# Patient Record
Sex: Female | Born: 1935 | Race: White | Hispanic: No | Smoking: Never smoker
Health system: Southern US, Community
[De-identification: ages and names within clinical notes are randomized; demographics above are authoritative.]

## PROBLEM LIST (undated history)

## (undated) DIAGNOSIS — I639 Cerebral infarction, unspecified: Secondary | ICD-10-CM

## (undated) DIAGNOSIS — K219 Gastro-esophageal reflux disease without esophagitis: Secondary | ICD-10-CM

## (undated) DIAGNOSIS — F32A Depression, unspecified: Secondary | ICD-10-CM

## (undated) DIAGNOSIS — F329 Major depressive disorder, single episode, unspecified: Secondary | ICD-10-CM

## (undated) DIAGNOSIS — N390 Urinary tract infection, site not specified: Secondary | ICD-10-CM

## (undated) DIAGNOSIS — I10 Essential (primary) hypertension: Secondary | ICD-10-CM

## (undated) DIAGNOSIS — E7800 Pure hypercholesterolemia, unspecified: Secondary | ICD-10-CM

## (undated) HISTORY — PX: CEREBRAL ANEURYSM REPAIR: SHX164

---

## 2009-01-13 ENCOUNTER — Ambulatory Visit: Payer: Self-pay | Admitting: Family Medicine

## 2009-01-13 DIAGNOSIS — E785 Hyperlipidemia, unspecified: Secondary | ICD-10-CM

## 2009-01-13 DIAGNOSIS — I10 Essential (primary) hypertension: Secondary | ICD-10-CM

## 2009-01-13 DIAGNOSIS — L259 Unspecified contact dermatitis, unspecified cause: Secondary | ICD-10-CM

## 2009-01-13 DIAGNOSIS — J069 Acute upper respiratory infection, unspecified: Secondary | ICD-10-CM | POA: Insufficient documentation

## 2009-01-14 ENCOUNTER — Encounter: Payer: Self-pay | Admitting: Family Medicine

## 2015-09-04 ENCOUNTER — Encounter (HOSPITAL_COMMUNITY): Payer: Self-pay | Admitting: Emergency Medicine

## 2015-09-04 ENCOUNTER — Inpatient Hospital Stay (HOSPITAL_COMMUNITY)
Admission: EM | Admit: 2015-09-04 | Discharge: 2015-09-10 | DRG: 689 | Disposition: A | Discharge status: Skilled Nursing Facility | Payer: Medicare Other | Attending: Internal Medicine | Admitting: Internal Medicine

## 2015-09-04 ENCOUNTER — Emergency Department (HOSPITAL_COMMUNITY): Payer: Medicare Other

## 2015-09-04 DIAGNOSIS — R739 Hyperglycemia, unspecified: Secondary | ICD-10-CM | POA: Diagnosis present

## 2015-09-04 DIAGNOSIS — F0390 Unspecified dementia without behavioral disturbance: Secondary | ICD-10-CM | POA: Diagnosis present

## 2015-09-04 DIAGNOSIS — R319 Hematuria, unspecified: Secondary | ICD-10-CM

## 2015-09-04 DIAGNOSIS — I10 Essential (primary) hypertension: Secondary | ICD-10-CM | POA: Diagnosis present

## 2015-09-04 DIAGNOSIS — N179 Acute kidney failure, unspecified: Secondary | ICD-10-CM | POA: Diagnosis present

## 2015-09-04 DIAGNOSIS — E7800 Pure hypercholesterolemia, unspecified: Secondary | ICD-10-CM | POA: Diagnosis present

## 2015-09-04 DIAGNOSIS — E876 Hypokalemia: Secondary | ICD-10-CM | POA: Diagnosis present

## 2015-09-04 DIAGNOSIS — E860 Dehydration: Secondary | ICD-10-CM | POA: Diagnosis present

## 2015-09-04 DIAGNOSIS — F329 Major depressive disorder, single episode, unspecified: Secondary | ICD-10-CM | POA: Diagnosis present

## 2015-09-04 DIAGNOSIS — Z79899 Other long term (current) drug therapy: Secondary | ICD-10-CM | POA: Diagnosis not present

## 2015-09-04 DIAGNOSIS — L8990 Pressure ulcer of unspecified site, unspecified stage: Secondary | ICD-10-CM | POA: Diagnosis not present

## 2015-09-04 DIAGNOSIS — E875 Hyperkalemia: Secondary | ICD-10-CM | POA: Diagnosis present

## 2015-09-04 DIAGNOSIS — G8191 Hemiplegia, unspecified affecting right dominant side: Secondary | ICD-10-CM | POA: Diagnosis present

## 2015-09-04 DIAGNOSIS — F32A Depression, unspecified: Secondary | ICD-10-CM | POA: Diagnosis present

## 2015-09-04 DIAGNOSIS — K219 Gastro-esophageal reflux disease without esophagitis: Secondary | ICD-10-CM | POA: Diagnosis present

## 2015-09-04 DIAGNOSIS — R32 Unspecified urinary incontinence: Secondary | ICD-10-CM | POA: Diagnosis present

## 2015-09-04 DIAGNOSIS — Z66 Do not resuscitate: Secondary | ICD-10-CM | POA: Diagnosis present

## 2015-09-04 DIAGNOSIS — N390 Urinary tract infection, site not specified: Secondary | ICD-10-CM | POA: Diagnosis present

## 2015-09-04 DIAGNOSIS — E785 Hyperlipidemia, unspecified: Secondary | ICD-10-CM

## 2015-09-04 DIAGNOSIS — G9340 Encephalopathy, unspecified: Secondary | ICD-10-CM | POA: Diagnosis present

## 2015-09-04 HISTORY — DX: Major depressive disorder, single episode, unspecified: F32.9

## 2015-09-04 HISTORY — DX: Gastro-esophageal reflux disease without esophagitis: K21.9

## 2015-09-04 HISTORY — DX: Cerebral infarction, unspecified: I63.9

## 2015-09-04 HISTORY — DX: Depression, unspecified: F32.A

## 2015-09-04 HISTORY — DX: Pure hypercholesterolemia, unspecified: E78.00

## 2015-09-04 HISTORY — DX: Urinary tract infection, site not specified: N39.0

## 2015-09-04 HISTORY — DX: Essential (primary) hypertension: I10

## 2015-09-04 LAB — COMPREHENSIVE METABOLIC PANEL
ALT: 24 U/L (ref 14–54)
AST: 37 U/L (ref 15–41)
Albumin: 3.6 g/dL (ref 3.5–5.0)
Alkaline Phosphatase: 92 U/L (ref 38–126)
Anion gap: 11 (ref 5–15)
BUN: 112 mg/dL — ABNORMAL HIGH (ref 6–20)
CO2: 15 mmol/L — ABNORMAL LOW (ref 22–32)
Calcium: 9.8 mg/dL (ref 8.9–10.3)
Chloride: 106 mmol/L (ref 101–111)
Creatinine, Ser: 4.9 mg/dL — ABNORMAL HIGH (ref 0.44–1.00)
GFR calc Af Amer: 9 mL/min — ABNORMAL LOW (ref >60)
GFR calc non Af Amer: 7 mL/min — ABNORMAL LOW (ref >60)
Glucose, Bld: 123 mg/dL — ABNORMAL HIGH (ref 65–99)
Potassium: 5.9 mmol/L — ABNORMAL HIGH (ref 3.5–5.1)
Sodium: 132 mmol/L — ABNORMAL LOW (ref 135–145)
Total Bilirubin: 1 mg/dL (ref 0.3–1.2)
Total Protein: 7.1 g/dL (ref 6.5–8.1)

## 2015-09-04 LAB — CBC WITH DIFFERENTIAL/PLATELET
Basophils Absolute: 0 10*3/uL (ref 0.0–0.1)
Basophils Relative: 0 %
Eosinophils Absolute: 0.2 10*3/uL (ref 0.0–0.7)
Eosinophils Relative: 2 %
HCT: 36.7 % (ref 36.0–46.0)
Hemoglobin: 11.8 g/dL — ABNORMAL LOW (ref 12.0–15.0)
Lymphocytes Relative: 10 %
Lymphs Abs: 1.1 10*3/uL (ref 0.7–4.0)
MCH: 29 pg (ref 26.0–34.0)
MCHC: 32.2 g/dL (ref 30.0–36.0)
MCV: 90.2 fL (ref 78.0–100.0)
Monocytes Absolute: 0.5 10*3/uL (ref 0.1–1.0)
Monocytes Relative: 5 %
Neutro Abs: 8.7 10*3/uL — ABNORMAL HIGH (ref 1.7–7.7)
Neutrophils Relative %: 83 %
Platelets: 484 10*3/uL — ABNORMAL HIGH (ref 150–400)
RBC: 4.07 MIL/uL (ref 3.87–5.11)
RDW: 16.3 % — ABNORMAL HIGH (ref 11.5–15.5)
WBC: 10.5 10*3/uL (ref 4.0–10.5)

## 2015-09-04 LAB — URINALYSIS, ROUTINE W REFLEX MICROSCOPIC
Bilirubin Urine: NEGATIVE
Glucose, UA: NEGATIVE mg/dL
Ketones, ur: NEGATIVE mg/dL
Nitrite: NEGATIVE
Protein, ur: 100 mg/dL — AB
Specific Gravity, Urine: 1.016 (ref 1.005–1.030)
pH: 5.5 (ref 5.0–8.0)

## 2015-09-04 LAB — I-STAT CG4 LACTIC ACID, ED: Lactic Acid, Venous: 1.9 mmol/L (ref 0.5–2.0)

## 2015-09-04 LAB — URINE MICROSCOPIC-ADD ON

## 2015-09-04 MED ORDER — DEXTROSE 5 % IV SOLN
1.0000 g | Freq: Once | INTRAVENOUS | Status: AC
  Administered 2015-09-04: 1 g via INTRAVENOUS
  Filled 2015-09-04: qty 1

## 2015-09-04 MED ORDER — HYDRALAZINE HCL 20 MG/ML IJ SOLN: 5.0000 mg | INTRAMUSCULAR | Status: DC | PRN

## 2015-09-04 MED ORDER — CALCIUM CARBONATE-VITAMIN D 500-200 MG-UNIT PO TABS
1.0000 | ORAL_TABLET | Freq: Two times a day (BID) | ORAL | Status: DC
  Administered 2015-09-05 – 2015-09-10 (×10): 1 via ORAL
  Filled 2015-09-04 (×11): qty 1

## 2015-09-04 MED ORDER — ATORVASTATIN CALCIUM 20 MG PO TABS
20.0000 mg | ORAL_TABLET | Freq: Every day | ORAL | Status: DC
  Administered 2015-09-05 – 2015-09-09 (×5): 20 mg via ORAL
  Filled 2015-09-04 (×5): qty 1
  Filled 2015-09-04: qty 2
  Filled 2015-09-04: qty 1
  Filled 2015-09-04 (×4): qty 2
  Filled 2015-09-04: qty 1
  Filled 2015-09-04: qty 2

## 2015-09-04 MED ORDER — PAROXETINE HCL 20 MG PO TABS
20.0000 mg | ORAL_TABLET | Freq: Every day | ORAL | Status: DC
  Administered 2015-09-05 – 2015-09-10 (×6): 20 mg via ORAL
  Filled 2015-09-04 (×6): qty 1

## 2015-09-04 MED ORDER — ONDANSETRON HCL 4 MG PO TABS: 4.0000 mg | ORAL_TABLET | Freq: Four times a day (QID) | ORAL | Status: DC | PRN

## 2015-09-04 MED ORDER — GLUCOSAMINE SULFATE 1000 MG PO TABS: 1.0000 | ORAL_TABLET | Freq: Every day | ORAL | Status: DC

## 2015-09-04 MED ORDER — ACETAMINOPHEN 325 MG PO TABS
650.0000 mg | ORAL_TABLET | Freq: Four times a day (QID) | ORAL | Status: DC | PRN
  Administered 2015-09-07 – 2015-09-10 (×3): 650 mg via ORAL
  Filled 2015-09-04 (×3): qty 2

## 2015-09-04 MED ORDER — SODIUM BICARBONATE 8.4 % IV SOLN
50.0000 meq | Freq: Once | INTRAVENOUS | Status: AC
  Administered 2015-09-05: 50 meq via INTRAVENOUS
  Filled 2015-09-04: qty 50

## 2015-09-04 MED ORDER — HEPARIN SODIUM (PORCINE) 5000 UNIT/ML IJ SOLN
5000.0000 [IU] | Freq: Three times a day (TID) | INTRAMUSCULAR | Status: DC
  Administered 2015-09-05 – 2015-09-10 (×16): 5000 [IU] via SUBCUTANEOUS
  Filled 2015-09-04 (×15): qty 1

## 2015-09-04 MED ORDER — SODIUM CHLORIDE 0.9 % IV BOLUS (SEPSIS): 500.0000 mL | INTRAVENOUS | Status: AC

## 2015-09-04 MED ORDER — ACETAMINOPHEN 650 MG RE SUPP: 650.0000 mg | Freq: Four times a day (QID) | RECTAL | Status: DC | PRN

## 2015-09-04 MED ORDER — ONDANSETRON HCL 4 MG/2ML IJ SOLN: 4.0000 mg | Freq: Four times a day (QID) | INTRAMUSCULAR | Status: DC | PRN

## 2015-09-04 MED ORDER — SODIUM POLYSTYRENE SULFONATE 15 GM/60ML PO SUSP
15.0000 g | Freq: Once | ORAL | Status: DC
  Filled 2015-09-04: qty 60

## 2015-09-04 MED ORDER — DEXTROSE 50 % IV SOLN
1.0000 | Freq: Once | INTRAVENOUS | Status: AC
  Administered 2015-09-05: 50 mL via INTRAVENOUS
  Filled 2015-09-04: qty 50

## 2015-09-04 MED ORDER — CEFEPIME HCL 2 G IJ SOLR
250.0000 mg | INTRAMUSCULAR | Status: DC
  Administered 2015-09-05 – 2015-09-08 (×4): 250 mg via INTRAVENOUS
  Filled 2015-09-04 (×6): qty 0.25

## 2015-09-04 MED ORDER — INSULIN ASPART 100 UNIT/ML IV SOLN
5.0000 [IU] | Freq: Once | INTRAVENOUS | Status: AC
  Administered 2015-09-05: 5 [IU] via INTRAVENOUS
  Filled 2015-09-04: qty 0.05

## 2015-09-04 MED ORDER — FAMOTIDINE 20 MG PO TABS
10.0000 mg | ORAL_TABLET | Freq: Every day | ORAL | Status: DC
  Administered 2015-09-05 – 2015-09-10 (×6): 10 mg via ORAL
  Filled 2015-09-04 (×6): qty 1

## 2015-09-04 MED ORDER — ALBUTEROL SULFATE (2.5 MG/3ML) 0.083% IN NEBU
10.0000 mg | INHALATION_SOLUTION | Freq: Once | RESPIRATORY_TRACT | Status: AC
  Administered 2015-09-04: 10 mg via RESPIRATORY_TRACT
  Filled 2015-09-04: qty 12

## 2015-09-04 MED ORDER — SODIUM CHLORIDE 0.9 % IV BOLUS (SEPSIS)
1000.0000 mL | INTRAVENOUS | Status: AC
  Administered 2015-09-04: 1000 mL via INTRAVENOUS

## 2015-09-04 MED ORDER — DEXTROSE 5 % IV SOLN
2.0000 g | Freq: Once | INTRAVENOUS | Status: DC
  Filled 2015-09-04: qty 2

## 2015-09-04 MED ORDER — SODIUM POLYSTYRENE SULFONATE 15 GM/60ML PO SUSP
30.0000 g | Freq: Once | ORAL | Status: AC
  Administered 2015-09-05: 30 g via ORAL

## 2015-09-04 MED ORDER — ADULT MULTIVITAMIN W/MINERALS CH
1.0000 | ORAL_TABLET | Freq: Every day | ORAL | Status: DC
  Administered 2015-09-05 – 2015-09-10 (×6): 1 via ORAL
  Filled 2015-09-04 (×6): qty 1

## 2015-09-04 MED ORDER — SODIUM CHLORIDE 0.9 % IV SOLN
INTRAVENOUS | Status: DC
  Administered 2015-09-05 – 2015-09-10 (×9): via INTRAVENOUS

## 2015-09-04 MED ORDER — CLOTRIMAZOLE 1 % EX CREA
TOPICAL_CREAM | Freq: Two times a day (BID) | CUTANEOUS | Status: DC
  Administered 2015-09-05 – 2015-09-10 (×11): via TOPICAL
  Filled 2015-09-04: qty 15

## 2015-09-04 MED ORDER — SODIUM CHLORIDE 0.9% FLUSH
3.0000 mL | Freq: Two times a day (BID) | INTRAVENOUS | Status: DC
  Administered 2015-09-06 – 2015-09-09 (×7): 3 mL via INTRAVENOUS

## 2015-09-04 NOTE — ED Notes (Signed)
Bed: WA07 Expected date:  Expected time:  Means of arrival:  Comments: EMS-UTI 

## 2015-09-04 NOTE — ED Notes (Signed)
Per EMS: Pt coming from [facility], pt has been treated for past 2 weeks for UTI, today has had confusion and increased weakness. Pt has pressure ulcer to buttocks area, states it is new.

## 2015-09-04 NOTE — H&P (Addendum)
[facility] History and Physical  Caitlin Wilkins ZOX:[id number] DOB: 04-Aug-1928 DOA: 09/04/2015  Referring physician: ED physician PCP: No primary care provider on file.  Specialists:   Chief Complaint: Altered mental status, generalized weakness, nausea, dysuria  HPI: Caitlin Wilkins is a 82 y.o. female with PMH of hypertension, hyperlipidemia, GERD, CVA, brain aneurysm (s/p of coiling per her son), right sided paralysis, who presents with altered mental status, generalized weakness, nausea and dysuria.  Per her son, patient has been confused in the past 2 or 3 days. She also has generalized weakness, nausea, but no vomiting, diarrhea or abdominal pain. Patient has dysuria, burning on urination. She denies chest pain, shortness of breath, cough, rashes. Patient has right-sided paralysis due to previous CVA and brain aneurysm coiling, which is her baseline.  In ED, patient was found to have positive urinalysis with moderate amount of leukocytes, lactate 1.90, WBC 10.5, temperature normal, no tachycardia, no tachypnea, potassium 5.9 with mild T-wave peaking in V4-V5, sodium 132, creatinine 4.90, bicarbonate 15. CT-renal stone study showed asymmetric right and posterior bladder wall thickening. No nephrolithiasis or hydroureteronephrosis bilaterally. Patient is admitted to inpatient for further evaluation and treatment.  EKG: Independently reviewed. QTC 427, LAD, mild T-wave peaking in in V 4-V5.   Where does patient live? Assistant living facility  Can patient participate in ADLs? Barely   Review of Systems: Could not be reviewed accurately due to altered mental status.  Allergy:  Allergies  Allergen Reactions  . Baclofen     Heart racing  . Oxycodone-Aspirin Nausea And Vomiting  . Sulfasalazine     Past Medical History  Diagnosis Date  . CVA (cerebral infarction)   . Hypercholesteremia   . Hypertension   . UTI (urinary tract infection)   . Depression   . GERD (gastroesophageal  reflux disease)     Past Surgical History  Procedure Laterality Date  . Cerebral aneurysm repair      s/p of coling per her son with right-sided paralysis    Social History:  reports that she has never smoked. She does not have any smokeless tobacco history on file. She reports that she does not drink alcohol. Her drug history is not on file.  Family History: Reviewed with patient, but patient does not know any of her family medical history.  Prior to Admission medications   Medication Sig Start Date End Date Taking? Authorizing Provider  atorvastatin (LIPITOR) 20 MG tablet Take 20 mg by mouth daily. 06/02/15  Yes Historical Provider, MD  Calcium Carbonate-Vitamin D (CALCIUM 600+D) 600-200 MG-UNIT TABS Take 1 tablet by mouth 2 (two) times daily.   Yes Historical Provider, MD  clotrimazole-betamethasone (LOTRISONE) cream Apply 1 application topically 2 (two) times daily as needed. irritations 08/01/15 07/31/16 Yes Historical Provider, MD  famotidine (PEPCID) 10 MG tablet Take 10 mg by mouth daily.   Yes Historical Provider, MD  Glucosamine Sulfate 1000 MG TABS Take 1 tablet by mouth daily.   Yes Historical Provider, MD  hydrochlorothiazide (HYDRODIURIL) 25 MG tablet Take 25 mg by mouth daily. 05/08/15 05/07/16 Yes Historical Provider, MD  Multiple Vitamins-Minerals (MULTIVITAMIN & MINERAL PO) Take 1 tablet by mouth daily.   Yes Historical Provider, MD  naproxen sodium (ANAPROX) 220 MG tablet Take 220 mg by mouth 2 (two) times daily as needed. pain   Yes Historical Provider, MD  PARoxetine (PAXIL) 20 MG tablet Take 20 mg by mouth daily. 05/08/15  Yes Historical Provider, MD  potassium chloride SA (KLOR-CON M20) 20 MEQ tablet  Take 20 mEq by mouth daily. 05/08/15 05/07/16 Yes Historical Provider, MD    Physical Exam: Filed Vitals:   09/04/15 2128 09/04/15 2200 09/04/15 2230 09/04/15 2303  BP:  124/54 112/46 135/46  Pulse:  101 104 110  Temp:    97.9 F (36.6 C)  TempSrc:    Oral  Resp:     20  Height:    5\' 2"  (1.575 m)  Weight:      SpO2: 90% 99% 96% 97%   General: Not in acute distress HEENT:       Eyes: PERRL, EOMI, no scleral icterus.       ENT: No discharge from the ears and nose, no pharynx injection, no tonsillar enlargement.        Neck: No JVD, no bruit, no mass felt. Heme: No neck lymph node enlargement. Cardiac: S1/S2, RRR, No murmurs, No gallops or rubs. Pulm: No rales, wheezing, rhonchi or rubs. Abd: Soft, nondistended, nontender, no rebound pain, no organomegaly, BS present. Ext: No pitting leg edema bilaterally. 2+DP/PT pulse bilaterally. Musculoskeletal: No joint deformities, No joint redness or warmth, no limitation of ROM in spin. Skin: Has decubitus ulcer over buttock  Neuro: Alert, confused, oriented to person (knows her son), but not time and place, cranial nerves II-XII grossly intact, has right-sided paralysis  Psych: Patient is not psychotic, no suicidal or hemocidal ideation.  Labs on Admission:  Basic Metabolic Panel:  Recent Labs Lab 09/04/15 1758  NA 132*  K 5.9*  CL 106  CO2 15*  GLUCOSE 123*  BUN 112*  CREATININE 4.90*  CALCIUM 9.8   Liver Function Tests:  Recent Labs Lab 09/04/15 1758  AST 37  ALT 24  ALKPHOS 92  BILITOT 1.0  PROT 7.1  ALBUMIN 3.6   No results for input(s): LIPASE, AMYLASE in the last 168 hours. No results for input(s): AMMONIA in the last 168 hours. CBC:  Recent Labs Lab 09/04/15 1758  WBC 10.5  NEUTROABS 8.7*  HGB 11.8*  HCT 36.7  MCV 90.2  PLT 484*   Cardiac Enzymes: No results for input(s): CKTOTAL, CKMB, CKMBINDEX, TROPONINI in the last 168 hours.  BNP (last 3 results) No results for input(s): BNP in the last 8760 hours.  ProBNP (last 3 results) No results for input(s): PROBNP in the last 8760 hours.  CBG: No results for input(s): GLUCAP in the last 168 hours.  Radiological Exams on Admission: Ct Renal Stone Study  09/04/2015  CLINICAL DATA:  Hematuria, multiple urinary  tract infection recently. EXAM: CT ABDOMEN AND PELVIS WITHOUT CONTRAST TECHNIQUE: Multidetector CT imaging of the abdomen and pelvis was performed following the standard protocol without IV contrast. COMPARISON:  None. FINDINGS: Calcified granulomas are identified within the liver. No focal liver lesion is identified. There is question sludge in the gallbladder. There is no inflammation surrounding the gallbladder. Calcified granuloma is identified in the spleen. The spleen is otherwise normal. The pancreas is normal. The adrenal glands are normal. There is no nephrolithiasis or hydroureteronephrosis bilaterally. Atherosclerosis of the aorta is identified without aneurysmal dilatation. There is no abdominal lymphadenopathy. There is no small bowel obstruction or diverticulitis. The appendix is not seen but no inflammation is noted around the cecum. The bladder is partially decompressed. There is asymmetric right and posterior bladder wall thickening. The uterus is normal. The lung bases are clear. Chronic compression deformity of L4 is identified. IMPRESSION: Asymmetric right and posterior bladder wall thickening. Further evaluation with direct with visualization on outpatient basis is recommended  to exclude underlying neoplasm. No nephrolithiasis or hydroureteronephrosis bilaterally. Electronically Signed   By: Sherian Rein M.D.   On: 09/04/2015 20:53    Assessment/Plan Principal Problem:   UTI (lower urinary tract infection) Active Problems:   HLD (hyperlipidemia)   Essential hypertension   Acute encephalopathy   Decubitus ulcer   Hyperkalemia   AKI (acute kidney injury) (HCC)   Depression   GERD (gastroesophageal reflux disease)   UTI (lower urinary tract infection): Patient with altered mental status, nausea and generalized weakness are most likely caused by UTI given positive urinalysis and dysuria. She  not septic on admission. Hemodynamically stable.   - Admit to telemetry (due to  hyperkalemia) - IV cefepime  was started by EDP, we'll continue - Follow up results of urine and blood cx and amend antibiotic regimen if needed per sensitivity results - prn Zofran for nausea - IVF: 2.5L of NS bolus in ED, followed by 75 cc/h  Acute encephalopathy: Most likely multifactorial, including UTI, electrolytes disturbance and aki -Frequent neuro check -Treat underlying issues.  Hyperkalemia: Potassium 5.9 with mild T-wave peaking. This is due to AKI and continuation of potassium supplement -treated with D50, 5 unit of Novolog, 50 mEq of sodium bicarbonate plus 30 g of Kayexalate -f/u by BMP -D/C potassium supplement  AKI: Creatinine 4.90, BUN 112. No previous creatinine on record. Assuming this is acute renal injury. No hydronephrosis by CT scan. Likely due to prerenal secondary to dehydration and continuation of diruetics and NSAIDs  - IVF as above - Check FeUrea - Follow up renal function by BMP - Hold HCTZ and naproxen.  HLD: Last LDL was not on record -Continue home medications: Lipitor -Check FLP  Essential hypertension: -Hold HCTZ due to worsening renal function -IV hydralazine when necessary  Depression: stable -continue Paxil  GERD: -Pepcid  Decubitus ulcer: -Consult wound care team  DVT ppx: Sq Heparin (Pt has AKI, sq heparin is better choice than sq Lovenox) Code Status: DNR Family Communication:  Yes, patient's son at bed side Disposition Plan: Admit to inpatient   Date of Service 09/04/2015    Lorretta Harp [facility] Pager [phone]  If 7PM-7AM, please contact night-coverage [email or web address] Password [facility] 09/04/2015, 11:29 PM

## 2015-09-04 NOTE — ED Notes (Signed)
I ATTEMPTED TO COLLECT LABS AND WAS UNSUCCESSFUL. 

## 2015-09-04 NOTE — Progress Notes (Signed)
Pharmacy Antibiotic Note  Caitlin Wilkins is a 82 y.o. female admitted on 09/04/2015 with UTI.  Patient presents with AMS from an assisted living facility and has been treated for a UTI for the past two weeks per MD note.  Antibiotic taken not reported.  Pharmacy has been consulted for Cefepime dosing.  Unsure of baseline renal status but today's SCr 4.90.  CrCl<10 ml/min.  Plan: Cefepime 1g x 1 given in ED.  After this dose, start 250 mg IV q24h. F/u SCr for dose adjustments. F/u narrowing of antibiotics, not sure if Pseudomonas coverage is needed here.     Temp (24hrs), Avg:97.5 F (36.4 C), Min:97.5 F (36.4 C), Max:97.5 F (36.4 C)   Recent Labs Lab 09/04/15 1758 09/04/15 1810  WBC 10.5  --   CREATININE 4.90*  --   LATICACIDVEN  --  1.90    CrCl cannot be calculated (Unknown ideal weight.).    Allergies  Allergen Reactions  . Baclofen     Heart racing  . Oxycodone-Aspirin Nausea And Vomiting  . Sulfasalazine     Antimicrobials this admission: 2/23 Cefepime >>   Dose adjustments this admission: -  Microbiology results: 2/23 BCx: collected 2/23 UCx: collected   Thank you for allowing pharmacy to be a part of this patient's care.  Clance Boll 09/04/2015 8:19 PM

## 2015-09-04 NOTE — ED Provider Notes (Signed)
CSN: [id number]     Arrival date & time 09/04/15  1707 History   First MD Initiated Contact with Patient 09/04/15 1720     Chief Complaint  Patient presents with  . Urinary Tract Infection     (Consider location/radiation/quality/duration/timing/severity/associated sxs/prior Treatment) Patient is a 82 y.o. female presenting with urinary tract infection.  Urinary Tract Infection  Patient presents to the Emergency Department via EMS from a SNF. Patient does not respond to questions appropriately and is altered. EMS reports that she presented from her facility due to increased confusion and weakness today. Patient has been treated for a UTI for the past two weeks. EMS states that she has a pressure ulcer. Patient cannot communicate that she has a new ulcer. Because she is altered, an accurate ROS was not obtained.  Past Medical History  Diagnosis Date  . CVA (cerebral infarction)   . Hypercholesteremia   . Hypertension   . UTI (urinary tract infection)    No past surgical history on file. No family history on file. Social History  Substance Use Topics  . Smoking status: Never Smoker   . Smokeless tobacco: None  . Alcohol Use: No   OB History    No data available     Review of Systems   All other systems negative except as documented in the HPI. All pertinent positives and negatives as reviewed in the HPI.   Allergies  Oxycodone-aspirin and Sulfasalazine  Home Medications   Prior to Admission medications   Not on File   BP 146/81 mmHg  Temp(Src) 97.5 F (36.4 C) (Rectal)  Resp 18 Physical Exam  Constitutional: No distress.  Patient looks slightly malnourished  HENT:  Head: Normocephalic and atraumatic.  Right Ear: External ear normal.  Left Ear: External ear normal.  Eyes: Conjunctivae and EOM are normal. Pupils are equal, round, and reactive to light.  Neck: Normal range of motion. Neck supple.  Cardiovascular: Normal rate and regular rhythm.   No murmur  heard. Pulmonary/Chest: Effort normal and breath sounds normal. No respiratory distress. She has no wheezes. She has no rales.  Abdominal: Soft. Bowel sounds are normal. She exhibits no distension. There is no tenderness. There is no rebound.  Genitourinary:    There is tenderness and lesion on the right labia. There is tenderness on the left labia. There is erythema in the vagina. There are signs of injury in the vagina.  Musculoskeletal: She exhibits no edema or tenderness.  Patient's right hand is contracted  Neurological: She is alert.  Patient is altered, does not respond appropriately to questions. Is not oriented to time, place.   Skin: There is pallor.  Cool extremities    ED Course  Procedures (including critical care time) Labs Review Labs Reviewed  COMPREHENSIVE METABOLIC PANEL - Abnormal; Notable for the following:    Sodium 132 (*)    Potassium 5.9 (*)    CO2 15 (*)    Glucose, Bld 123 (*)    BUN 112 (*)    Creatinine, Ser 4.90 (*)    GFR calc non Af Amer 7 (*)    GFR calc Af Amer 9 (*)    All other components within normal limits  CBC WITH DIFFERENTIAL/PLATELET - Abnormal; Notable for the following:    Hemoglobin 11.8 (*)    RDW 16.3 (*)    Platelets 484 (*)    Neutro Abs 8.7 (*)    All other components within normal limits  URINALYSIS, ROUTINE W REFLEX MICROSCOPIC (  NOT AT [facility]) - Abnormal; Notable for the following:    APPearance CLOUDY (*)    Hgb urine dipstick LARGE (*)    Protein, ur 100 (*)    Leukocytes, UA MODERATE (*)    All other components within normal limits  URINE MICROSCOPIC-ADD ON - Abnormal; Notable for the following:    Squamous Epithelial / LPF 6-30 (*)    Bacteria, UA MANY (*)    All other components within normal limits  PROTIME-INR - Abnormal; Notable for the following:    Prothrombin Time 15.7 (*)    All other components within normal limits  URINE CULTURE  CULTURE, BLOOD (ROUTINE X 2)  CULTURE, BLOOD (ROUTINE X 2)  MRSA PCR  SCREENING  APTT  CREATININE, URINE, RANDOM  UREA NITROGEN, URINE  LIPID PANEL  COMPREHENSIVE METABOLIC PANEL  CBC  I-STAT CG4 LACTIC ACID, ED    Imaging Review No results found. I have personally reviewed and evaluated these images and lab results as part of my medical decision-making.   EKG Interpretation None     1830 son and grandson present in the room, state that patient lives in an independent living facility and that she often does not change her briefs. They have been sending CNAs to go and check on her frequently but even then she still goes long periods of time without changing them. They state that sometimes patient has difficulty communicating what she wants to say, but that today she is altered and confused and is not answering questions appropriately at all.  MDM   Final diagnoses:  None   patient be admitted to the hospital for further evaluation and care.  Spoke with the [facility] who will see the patient      Charlestine Night, PA-C 09/05/15 1610  Derwood Kaplan, MD 09/05/15 [phone]

## 2015-09-04 NOTE — Progress Notes (Signed)
PHARMACIST - PHYSICIAN ORDER COMMUNICATION  CONCERNING: P&T Medication Policy on Herbal Medications  DESCRIPTION:  This patient's order for: Glucosamine has been noted.  This product(s) is classified as an "herbal" or natural product. Due to a lack of definitive safety studies or FDA approval, nonstandard manufacturing practices, plus the potential risk of unknown drug-drug interactions while on inpatient medications, the Pharmacy and Therapeutics Committee does not permit the use of "herbal" or natural products of this type within [facility].   ACTION TAKEN: The pharmacy department is unable to verify this order at this time and your patient has been informed of this safety policy. Please reevaluate patient's clinical condition at discharge and address if the herbal or natural product(s) should be resumed at that time.  Charolotte Eke, PharmD, pager [phone]. 09/04/2015,9:57 PM.

## 2015-09-05 DIAGNOSIS — F329 Major depressive disorder, single episode, unspecified: Secondary | ICD-10-CM

## 2015-09-05 LAB — COMPREHENSIVE METABOLIC PANEL
ALK PHOS: 70 U/L (ref 38–126)
ALT: 20 U/L (ref 14–54)
AST: 28 U/L (ref 15–41)
Albumin: 2.8 g/dL — ABNORMAL LOW (ref 3.5–5.0)
Anion gap: 12 (ref 5–15)
BILIRUBIN TOTAL: 0.8 mg/dL (ref 0.3–1.2)
BUN: 121 mg/dL — AB (ref 6–20)
CALCIUM: 9.1 mg/dL (ref 8.9–10.3)
CHLORIDE: 107 mmol/L (ref 101–111)
CO2: 12 mmol/L — ABNORMAL LOW (ref 22–32)
CREATININE: 4.65 mg/dL — AB (ref 0.44–1.00)
GFR, EST AFRICAN AMERICAN: 9 mL/min — AB (ref >60)
GFR, EST NON AFRICAN AMERICAN: 8 mL/min — AB (ref >60)
Glucose, Bld: 288 mg/dL — ABNORMAL HIGH (ref 65–99)
Potassium: 4.9 mmol/L (ref 3.5–5.1)
Sodium: 131 mmol/L — ABNORMAL LOW (ref 135–145)
TOTAL PROTEIN: 5.6 g/dL — AB (ref 6.5–8.1)

## 2015-09-05 LAB — CBC
HCT: 32.8 % — ABNORMAL LOW (ref 36.0–46.0)
Hemoglobin: 10.7 g/dL — ABNORMAL LOW (ref 12.0–15.0)
MCH: 29.8 pg (ref 26.0–34.0)
MCHC: 32.6 g/dL (ref 30.0–36.0)
MCV: 91.4 fL (ref 78.0–100.0)
PLATELETS: 369 10*3/uL (ref 150–400)
RBC: 3.59 MIL/uL — AB (ref 3.87–5.11)
RDW: 16.5 % — ABNORMAL HIGH (ref 11.5–15.5)
WBC: 8.5 10*3/uL (ref 4.0–10.5)

## 2015-09-05 LAB — LIPID PANEL
CHOLESTEROL: 110 mg/dL (ref 0–200)
HDL: 24 mg/dL — AB (ref >40)
LDL CALC: 53 mg/dL (ref 0–99)
TRIGLYCERIDES: 163 mg/dL — AB (ref <150)
Total CHOL/HDL Ratio: 4.6 RATIO
VLDL: 33 mg/dL (ref 0–40)

## 2015-09-05 LAB — CREATININE, URINE, RANDOM: Creatinine, Urine: 73.32 mg/dL

## 2015-09-05 LAB — APTT: aPTT: 26 seconds (ref 24–37)

## 2015-09-05 LAB — PROTIME-INR
INR: 1.28 (ref 0.00–1.49)
PROTHROMBIN TIME: 15.7 s — AB (ref 11.6–15.2)

## 2015-09-05 LAB — MRSA PCR SCREENING: MRSA BY PCR: NEGATIVE

## 2015-09-05 LAB — GLUCOSE, CAPILLARY: Glucose-Capillary: 133 mg/dL — ABNORMAL HIGH (ref 65–99)

## 2015-09-05 MED ORDER — ENSURE ENLIVE PO LIQD
237.0000 mL | Freq: Two times a day (BID) | ORAL | Status: DC
  Administered 2015-09-05 – 2015-09-10 (×8): 237 mL via ORAL

## 2015-09-05 NOTE — Progress Notes (Signed)
Pt has order for strict I&O.  Pt is incontinent.  Dr Arthor Captain aware, does not want foley catheter placed.  Continue to document output occurences.

## 2015-09-05 NOTE — Progress Notes (Signed)
[facility] PROGRESS NOTE   Caitlin Wilkins JXB:[id number] DOB: Dec 13, 1928 DOA: 09/04/2015 PCP: No primary care provider on file.  HPI/Subjective: Seen with son at bedside, she is awake but confused.  Assessment/Plan: Principal Problem:   UTI (lower urinary tract infection) Active Problems:   HLD (hyperlipidemia)   Essential hypertension   Acute encephalopathy   Decubitus ulcer   Hyperkalemia   AKI (acute kidney injury) (HCC)   Depression   GERD (gastroesophageal reflux disease)   UTI (lower urinary tract infection):  Presented with altered mental status, dysuria and urinalysis consistent with UTI. Started on cefepime in the emergency department will continue. We'll adjust antibiotics according to the culture results.  Acute encephalopathy:  Is likely multifactorial including UTI, dehydration and acute renal failure. Hopefully this will improve as the above-mentioned disturbances improves.  Hyperkalemia:  Potassium 5.9 with mild T-wave peaking. This is due to AKI and continuation of potassium supplement Potassium down to 4.9.  AKI:  Creatinine 4.90, BUN 112. No previous creatinine on record. Assuming this is acute renal injury. No hydronephrosis by CT scan. Likely due to prerenal secondary to dehydration and continuation of diruetics and NSAIDs  IV fluids, continue to watch intake/output very closely. Hold HCTZ and naproxen.  HLD: Last LDL was not on record -Continue home medications: Lipitor -Check FLP  Essential hypertension: -Hold HCTZ due to worsening renal function -IV hydralazine when necessary  Depression: stable -continue Paxil  GERD: -Pepcid  Decubitus ulcer: -Consult wound care team   Code Status: DNR Family Communication: Plan discussed with the patient. Disposition Plan: Remains inpatient Diet: Diet Heart Room service appropriate?: Yes with Assist; Fluid consistency::  Thin  Consultants:  None  Procedures:  None  Antibiotics:  Cefepime   Objective: Filed Vitals:   09/04/15 2303 09/05/15 0531  BP: 135/46 110/49  Pulse: 110 98  Temp: 97.9 F (36.6 C) 97.6 F (36.4 C)  Resp: 20 18    Intake/Output Summary (Last 24 hours) at 09/05/15 1223 Last data filed at 09/05/15 1000  Gross per 24 hour  Intake    315 ml  Output      2 ml  Net    313 ml   Filed Weights   09/04/15 2050 09/04/15 2303  Weight: 63.504 kg (140 lb) 50.8 kg (111 lb 15.9 oz)    Exam: General: Alert and awake, oriented x3, not in any acute distress. HEENT: anicteric sclera, pupils reactive to light and accommodation, EOMI CVS: S1-S2 clear, no murmur rubs or gallops Chest: clear to auscultation bilaterally, no wheezing, rales or rhonchi Abdomen: soft nontender, nondistended, normal bowel sounds, no organomegaly Extremities: no cyanosis, clubbing or edema noted bilaterally Neuro: Cranial nerves II-XII intact, no focal neurological deficits  Data Reviewed: Basic Metabolic Panel:  Recent Labs Lab 09/04/15 1758 09/05/15 0451  NA 132* 131*  K 5.9* 4.9  CL 106 107  CO2 15* 12*  GLUCOSE 123* 288*  BUN 112* 121*  CREATININE 4.90* 4.65*  CALCIUM 9.8 9.1   Liver Function Tests:  Recent Labs Lab 09/04/15 1758 09/05/15 0451  AST 37 28  ALT 24 20  ALKPHOS 92 70  BILITOT 1.0 0.8  PROT 7.1 5.6*  ALBUMIN 3.6 2.8*   No results for input(s): LIPASE, AMYLASE in the last 168 hours. No results for input(s): AMMONIA in the last 168 hours. CBC:  Recent Labs Lab 09/04/15 1758 09/05/15 0451  WBC 10.5 8.5  NEUTROABS 8.7*  --   HGB 11.8* 10.7*  HCT 36.7 32.8*  MCV 90.2  91.4  PLT 484* 369   Cardiac Enzymes: No results for input(s): CKTOTAL, CKMB, CKMBINDEX, TROPONINI in the last 168 hours. BNP (last 3 results) No results for input(s): BNP in the last 8760 hours.  ProBNP (last 3 results) No results for input(s): PROBNP in the last 8760  hours.  CBG:  Recent Labs Lab 09/05/15 0738  GLUCAP 133*    Micro Recent Results (from the past 240 hour(s))  MRSA PCR Screening     Status: None   Collection Time: 09/05/15 12:38 AM  Result Value Ref Range Status   MRSA by PCR NEGATIVE NEGATIVE Final    Comment:        The GeneXpert MRSA Assay (FDA approved for NASAL specimens only), is one component of a comprehensive MRSA colonization surveillance program. It is not intended to diagnose MRSA infection nor to guide or monitor treatment for MRSA infections.      Studies: Ct Renal Stone Study  09/04/2015  CLINICAL DATA:  Hematuria, multiple urinary tract infection recently. EXAM: CT ABDOMEN AND PELVIS WITHOUT CONTRAST TECHNIQUE: Multidetector CT imaging of the abdomen and pelvis was performed following the standard protocol without IV contrast. COMPARISON:  None. FINDINGS: Calcified granulomas are identified within the liver. No focal liver lesion is identified. There is question sludge in the gallbladder. There is no inflammation surrounding the gallbladder. Calcified granuloma is identified in the spleen. The spleen is otherwise normal. The pancreas is normal. The adrenal glands are normal. There is no nephrolithiasis or hydroureteronephrosis bilaterally. Atherosclerosis of the aorta is identified without aneurysmal dilatation. There is no abdominal lymphadenopathy. There is no small bowel obstruction or diverticulitis. The appendix is not seen but no inflammation is noted around the cecum. The bladder is partially decompressed. There is asymmetric right and posterior bladder wall thickening. The uterus is normal. The lung bases are clear. Chronic compression deformity of L4 is identified. IMPRESSION: Asymmetric right and posterior bladder wall thickening. Further evaluation with direct with visualization on outpatient basis is recommended to exclude underlying neoplasm. No nephrolithiasis or hydroureteronephrosis bilaterally.  Electronically Signed   By: Sherian Rein M.D.   On: 09/04/2015 20:53    Scheduled Meds: . atorvastatin  20 mg Oral Daily  . calcium-vitamin D  1 tablet Oral BID  . ceFEPime (MAXIPIME) IV  250 mg Intravenous Q24H  . clotrimazole   Topical BID  . famotidine  10 mg Oral Daily  . heparin  5,000 Units Subcutaneous 3 times per day  . multivitamin with minerals  1 tablet Oral Daily  . PARoxetine  20 mg Oral Daily  . sodium chloride flush  3 mL Intravenous Q12H   Continuous Infusions: . sodium chloride 75 mL/hr at 09/05/15 0433       Time spent: 35 minutes    [facility] A  [facility] Pager [phone] If 7PM-7AM, please contact night-coverage at [email or web address], password [facility] 09/05/2015, 12:23 PM  LOS: 1 day

## 2015-09-05 NOTE — Consult Note (Signed)
WOC wound consult note Reason for Consult: IAD (incontinence associated dermatitis) with superficial skin injury (partial thickness, not pressure) Wound type: Moisture associated skin damage Pressure Ulcer POA: No Measurement: 6cm x 0.4cm x 0.2cm and distally 0.6cm round x 0.2cm Wound ZOX:WRUEA, pink moist Drainage (amount, consistency, odor) scant serous, no odor Periwound:erythematous, no induration, no warmth Dressing procedure/placement/frequency: POC will include turning and repositioning, heel flotation, and pressure injury prevention to ITs and sacrum as well as topical care. WOC nursing team will not follow, but will remain available to this patient, the nursing and medical teams.  Please re-consult if needed. Thanks, Ladona Mow, MSN, RN, GNP, [place]  Pager# [phone]

## 2015-09-05 NOTE — Progress Notes (Signed)
Initial Nutrition Assessment  DOCUMENTATION CODES:   Not applicable  INTERVENTION:  - Will order Ensure Enlive po BID, each supplement provides 350 kcal and 20 grams of protein - Tech/RN to assist with meal orders and feeding assistance - Encourage PO intakes of meals and supplements - RD will continue to monitor for needs  NUTRITION DIAGNOSIS:   Inadequate oral intake related to lethargy/confusion as evidenced by meal completion < 25%.  GOAL:   Patient will meet greater than or equal to 90% of their needs  MONITOR:   PO intake, Supplement acceptance, Weight trends, Labs, Skin, I & O's  REASON FOR ASSESSMENT:   Malnutrition Screening Tool  ASSESSMENT:   82 y.o. female with PMH of hypertension, hyperlipidemia, GERD, CVA, brain aneurysm (s/p of coiling per her son), right sided paralysis, who presents with altered mental status, generalized weakness, nausea and dysuria.  Pt seen for MST. BMI indicates normal weight. Pt was NPO until diet advancement to Heart Healthy diet at 0842 today. Per flowsheet, pt consumed 25% of breakfast this AM. Pt with AMS; she is unable to provide any information and no family/visitors are present at this time. Lunch tray had arrived prior to RD visit and tech alerted.  Physical assessment indicates mild to moderate muscle wasting to upper body, no fat wasting, no edema. No recent weight hx available in the chart for comparison to current weight.   Not meeting needs. Will continue to monitor intakes and associated needs. Medications reviewed. Labs reviewed; Na: 131 mmol/L, BUN/creatinine elevated, GFR: 8 mg/dL.   Diet Order:  Diet Heart Room service appropriate?: Yes with Assist; Fluid consistency:: Thin  Skin:  Wound (see comment) (Stage 2 bilateral buttocks pressure ulcer, Stage 1 bilateral heel pressure ulcers)  Last BM:  2/24  Height:   Ht Readings from Last 1 Encounters:  09/04/15  (1.575 m)    Weight:   Wt Readings from Last 1  Encounters:  09/04/15 111 lb 15.9 oz (50.8 kg)    Ideal Body Weight:  50 kg (kg)  BMI:  Body mass index is 20.48 kg/(m^2).  Estimated Nutritional Needs:   Kcal:  1350-1550  Protein:  55-65 grams  Fluid:  1.8-2 L/day  EDUCATION NEEDS:   No education needs identified at this time     Trenton Gammon, RD, LDN Inpatient Clinical Dietitian Pager # [phone] After hours/weekend pager # [phone]

## 2015-09-05 NOTE — Evaluation (Signed)
Occupational Therapy Evaluation Patient Details Name: Caitlin Wilkins MRN: [id number] DOB: Sep 13, 1928 Today's Date: 09/05/2015    History of Present Illness 82 yo female admitted 09/04/15 from [facility] independent living per noes with Altered mental status, generalized weakness, nausea, dysuria, UTI.   Clinical Impression   Pt admitted with above. She demonstrates the below listed deficits and will benefit from continued OT to maximize safety and independence with BADLs.  Pt presents to OT with residual Rt hemiplegia, impaired balance, generalized weakness, impaired cognition.  Currently, she requires total A for ADLs.  Per chart, pt resided at [facility].  If they are able to provide necessary level of assist at discharge, then recommend back to [facility] with HHOT.   If they are not able to provide necessary level of care, then she will need SNF level rehab.       Follow Up Recommendations  SNF;Home health OT;Supervision/Assistance - 24 hour    Equipment Recommendations  None recommended by OT    Recommendations for Other Services       Precautions / Restrictions Precautions Precautions: Fall Precaution Comments: R hemiplegia, incontinence      Mobility Bed Mobility Overal bed mobility: Needs Assistance;+2 for physical assistance;+ 2 for safety/equipment Bed Mobility: Rolling;Sidelying to Sit;Sit to Supine Rolling: Total assist;+2 for physical assistance;+2 for safety/equipment Sidelying to sit: Total assist;+2 for physical assistance;+2 for safety/equipment;HOB elevated   Sit to supine: Total assist;+2 for physical assistance;+2 for safety/equipment   General bed mobility comments: legs are rigid with turning to the sides, trunk is rigid also. Assist to  sit upright. patient initiated return to side after indicating that she is ready to lie down.  Transfers                 General transfer comment: patient declined by starting to lie down     Balance Overall balance assessment: Needs assistance Sitting-balance support: Feet supported;Single extremity supported Sitting balance-Leahy Scale: Poor   Postural control: Posterior lean;Right lateral lean                                  ADL Overall ADL's : Needs assistance/impaired Eating/Feeding: Moderate assistance;Bed level   Grooming: Wash/dry hands;Wash/dry face;Oral care;Brushing hair;Maximal assistance;Sitting;Bed level   Upper Body Bathing: Maximal assistance;Bed level   Lower Body Bathing: Total assistance;Bed level   Upper Body Dressing : Total assistance;Bed level   Lower Body Dressing: Total assistance;Bed level   Toilet Transfer: Total assistance Toilet Transfer Details (indicate cue type and reason): Pt refused OOB transfer  Toileting- Clothing Manipulation and Hygiene: Total assistance;Bed level       Functional mobility during ADLs: Total assistance General ADL Comments: Pt follow commands inconsitently.  Difficult to engage in activity      Vision Additional Comments: Pt unable to engage in visual assessment    Perception     Praxis      Pertinent Vitals/Pain Pain Assessment: Faces Pain Score: 4  Faces Pain Scale: Hurts little more Pain Location: buttock  Pain Descriptors / Indicators: Discomfort Pain Intervention(s): Limited activity within patient's tolerance;Monitored during session;Repositioned     Hand Dominance Left   Extremity/Trunk Assessment Upper Extremity Assessment Upper Extremity Assessment: RUE deficits/detail;LUE deficits/detail RUE Deficits / Details: Pt with residual Rt hemiplegia.   Flexor contractures present throughout Rt UE  RUE Coordination: decreased fine motor;decreased gross motor   Lower Extremity Assessment Lower Extremity Assessment: Defer to PT evaluation  RLE Deficits / Details: R leg with tendency to be in extension, plantoar flexed ankle LLE Deficits / Details: did lift leg a few inches from  the bed.   Cervical / Trunk Assessment Cervical / Trunk Assessment: Other exceptions Cervical / Trunk Exceptions: leans to the R   Communication Communication Communication: Expressive difficulties   Cognition Arousal/Alertness: Awake/alert Behavior During Therapy: Flat affect Overall Cognitive Status: No family/caregiver present to determine baseline cognitive functioning                     General Comments       Exercises       Shoulder Instructions      Home Living Family/patient expects to be discharged to:: Assisted living                                 Additional Comments: No family present to confirm       Prior Functioning/Environment Level of Independence: Needs assistance        Comments: Pt unable to provide info and no family present    OT Diagnosis: Generalized weakness;Cognitive deficits;Hemiplegia dominant side   OT Problem List: Decreased strength;Decreased range of motion;Decreased activity tolerance;Impaired balance (sitting and/or standing);Decreased coordination;Decreased cognition;Decreased safety awareness;Decreased knowledge of use of DME or AE;Impaired tone;Pain;Impaired UE functional use   OT Treatment/Interventions: Self-care/ADL training;Neuromuscular education;DME and/or AE instruction;Therapeutic activities;Cognitive remediation/compensation;Visual/perceptual remediation/compensation;Patient/family education;Balance training;Manual therapy    OT Goals(Current goals can be found in the care plan section) Acute Rehab OT Goals Patient Stated Goal: non stated OT Goal Formulation: With patient Time For Goal Achievement: 09/19/15 Potential to Achieve Goals: Good ADL Goals Pt Will Perform Eating: with modified independence;sitting Pt Will Perform Grooming: sitting;with set-up Pt Will Perform Upper Body Bathing: with mod assist;sitting Pt Will Transfer to Toilet: with mod assist;stand pivot transfer;bedside commode Pt  Will Perform Toileting - Clothing Manipulation and hygiene: with mod assist;sit to/from stand  OT Frequency: Min 2X/week   Barriers to D/C: Decreased caregiver support  unsure if facilty able to provide level of care pt is requiring        Co-evaluation PT/OT/SLP Co-Evaluation/Treatment: Yes Reason for Co-Treatment: For patient/therapist safety PT goals addressed during session: Mobility/safety with mobility OT goals addressed during session: ADL's and self-care      End of Session Nurse Communication: Mobility status  Activity Tolerance: Patient limited by lethargy;Patient limited by fatigue Patient left: in bed;with call bell/phone within reach;with bed alarm set   Time: 4540-9811 OT Time Calculation (min): 24 min Charges:  OT General Charges $OT Visit: 1 Procedure OT Evaluation $OT Eval Moderate Complexity: 1 Procedure G-Codes:    Jeani Hawking M 09/07/15, 7:34 PM

## 2015-09-05 NOTE — Evaluation (Signed)
Physical Therapy Evaluation Patient Details Name: Caitlin Wilkins MRN: [id number] DOB: 08-18-1928 Today's Date: 09/05/2015   History of Present Illness  82 yo female admitted 09/04/15 from [facility] independent living per noes with Altered mental status, generalized weakness, nausea, dysuria, UTI.  Clinical Impression  Patient is unable to provide information on prior level of function and no family present.  Today, patient requires total assist for  Bed mobility. Patient will benefit from PT to address problems listed in the note below.  Chart indicates from Independent Living. Will require 24/7 assist at this time.  Follow Up Recommendations SNF;Supervision/Assistance - 24 hour    Equipment Recommendations  None recommended by PT    Recommendations for Other Services       Precautions / Restrictions Precautions Precautions: Fall Precaution Comments: R hemiplegia, incontinence      Mobility  Bed Mobility Overal bed mobility: Needs Assistance;+2 for physical assistance;+ 2 for safety/equipment Bed Mobility: Rolling;Sidelying to Sit;Sit to Supine Rolling: Total assist;+2 for physical assistance;+2 for safety/equipment Sidelying to sit: Total assist;+2 for physical assistance;+2 for safety/equipment;HOB elevated   Sit to supine: Total assist;+2 for physical assistance;+2 for safety/equipment   General bed mobility comments: legs are rigid with turning to the sides, trunk is rigid also. Assist to  sit upright. Patient initiated return to side after indicating that she is ready to lie down. Assist to place the legs onto the bed.  Transfers                 General transfer comment: patient declined by starting to lie down  Ambulation/Gait                Stairs            Wheelchair Mobility    Modified Rankin (Stroke Patients Only)       Balance Overall balance assessment: Needs assistance Sitting-balance support: Single extremity supported;Feet  supported Sitting balance-Leahy Scale: Poor   Postural control: Posterior lean;Right lateral lean                                   Pertinent Vitals/Pain Pain Assessment: Faces Faces Pain Scale: Hurts little more Pain Location: buttocks during pericare and sitting Pain Descriptors / Indicators: Discomfort;Grimacing Pain Intervention(s): Limited activity within patient's tolerance    Home Living Family/patient expects to be discharged to::  (Indpendent Living)                      Prior Function           Comments: patient unable to provide  level of care PTA.     Hand Dominance        Extremity/Trunk Assessment   Upper Extremity Assessment: Defer to OT evaluation           Lower Extremity Assessment: RLE deficits/detail;LLE deficits/detail RLE Deficits / Details: R leg with tendency to be in extension, plantoar flexed ankle LLE Deficits / Details: did lift leg a few inches from the bed.  Cervical / Trunk Assessment: Other exceptions  Communication   Communication: Expressive difficulties (at times words are sensible, mostly mute)  Cognition Arousal/Alertness: Awake/alert Behavior During Therapy: WFL for tasks assessed/performed Overall Cognitive Status: No family/caregiver present to determine baseline cognitive functioning                      General Comments  Exercises        Assessment/Plan    PT Assessment Patient needs continued PT services  PT Diagnosis Generalized weakness;Acute pain;Hemiplegia dominant side;Altered mental status   PT Problem List Decreased strength;Decreased range of motion;Decreased activity tolerance;Decreased balance;Decreased mobility;Decreased cognition;Impaired tone;Decreased skin integrity;Pain  PT Treatment Interventions Functional mobility training;Therapeutic activities;Therapeutic exercise;Balance training;Patient/family education   PT Goals (Current goals can be found in the Care  Plan section) Acute Rehab PT Goals Patient Stated Goal: non stated PT Goal Formulation: Patient unable to participate in goal setting Time For Goal Achievement: 09/19/15 Potential to Achieve Goals: Fair    Frequency Min 2X/week   Barriers to discharge        Co-evaluation   yes for patient and therapist safety For mobility and ADLS              End of Session   Activity Tolerance: Patient tolerated treatment well Patient left: in bed;with call bell/phone within reach;with bed alarm set Nurse Communication: Mobility status         Time: 1610-9604 PT Time Calculation (min) (ACUTE ONLY): 23 min   Charges:   PT Evaluation $PT Eval Low Complexity: 1 Procedure     PT G CodesRada Hay 09/05/2015, 4:06 PM  Blanchard Kelch PT [phone]

## 2015-09-05 NOTE — Progress Notes (Signed)
CSW received consult that patient was admitted from: [facility] Independent Living (ph#: [phone]). Awaiting PT evaluation for disposition recommendations - will check back incase patient needs higher level of care.    Lincoln Maxin, LCSW [facility] Clinical Social Worker cell #: [phone]

## 2015-09-06 LAB — RENAL FUNCTION PANEL
Albumin: 2.7 g/dL — ABNORMAL LOW (ref 3.5–5.0)
Anion gap: 10 (ref 5–15)
BUN: 94 mg/dL — AB (ref 6–20)
CHLORIDE: 108 mmol/L (ref 101–111)
CO2: 20 mmol/L — AB (ref 22–32)
CREATININE: 3.89 mg/dL — AB (ref 0.44–1.00)
Calcium: 8.4 mg/dL — ABNORMAL LOW (ref 8.9–10.3)
GFR calc Af Amer: 11 mL/min — ABNORMAL LOW (ref >60)
GFR, EST NON AFRICAN AMERICAN: 10 mL/min — AB (ref >60)
GLUCOSE: 100 mg/dL — AB (ref 65–99)
POTASSIUM: 2.4 mmol/L — AB (ref 3.5–5.1)
Phosphorus: 4.3 mg/dL (ref 2.5–4.6)
Sodium: 138 mmol/L (ref 135–145)

## 2015-09-06 LAB — URINE CULTURE

## 2015-09-06 LAB — UREA NITROGEN, URINE: Urea Nitrogen, Ur: 622 mg/dL

## 2015-09-06 LAB — GLUCOSE, CAPILLARY: GLUCOSE-CAPILLARY: 93 mg/dL (ref 65–99)

## 2015-09-06 MED ORDER — POTASSIUM CHLORIDE 10 MEQ/100ML IV SOLN
10.0000 meq | INTRAVENOUS | Status: DC
  Administered 2015-09-06 (×2): 10 meq via INTRAVENOUS
  Filled 2015-09-06 (×2): qty 100

## 2015-09-06 MED ORDER — POTASSIUM CHLORIDE CRYS ER 20 MEQ PO TBCR
40.0000 meq | EXTENDED_RELEASE_TABLET | Freq: Once | ORAL | Status: AC
  Administered 2015-09-06: 40 meq via ORAL
  Filled 2015-09-06: qty 2

## 2015-09-06 MED ORDER — POTASSIUM CHLORIDE CRYS ER 20 MEQ PO TBCR: 40.0000 meq | EXTENDED_RELEASE_TABLET | Freq: Four times a day (QID) | ORAL | Status: DC

## 2015-09-06 MED ORDER — MAGNESIUM SULFATE 2 GM/50ML IV SOLN
2.0000 g | Freq: Once | INTRAVENOUS | Status: AC
  Administered 2015-09-06: 2 g via INTRAVENOUS
  Filled 2015-09-06: qty 50

## 2015-09-06 MED ORDER — POTASSIUM CHLORIDE CRYS ER 20 MEQ PO TBCR
40.0000 meq | EXTENDED_RELEASE_TABLET | Freq: Four times a day (QID) | ORAL | Status: AC
  Administered 2015-09-06 (×2): 40 meq via ORAL
  Filled 2015-09-06 (×2): qty 2

## 2015-09-06 MED ORDER — POTASSIUM CHLORIDE 10 MEQ/100ML IV SOLN: 10.0000 meq | INTRAVENOUS | Status: DC

## 2015-09-06 NOTE — Progress Notes (Signed)
CRITICAL VALUE ALERT  Critical value received:  K 2.4  Date of notification:  2/25  Time of notification:  0637  Critical value read back:Yes.    Nurse who received alert:  Meggie Laseter, Lavone Orn, RN  MD notified (1st page):  Lynch  Time of first page:  [phone]  MD notified (2nd page):  Time of second page:  Responding MD:  Burnadette Peter  Time MD responded:  [phone]

## 2015-09-06 NOTE — Progress Notes (Signed)
[facility] PROGRESS NOTE   Caitlin Wilkins ZOX:[id number] DOB: 1929/03/27 DOA: 09/04/2015 PCP: No primary care provider on file.  HPI/Subjective: Patient is awake but pleasantly confused secondary to dementia.  Assessment/Plan: Principal Problem:   UTI (lower urinary tract infection) Active Problems:   HLD (hyperlipidemia)   Essential hypertension   Acute encephalopathy   Decubitus ulcer   Hyperkalemia   AKI (acute kidney injury) (HCC)   Depression   GERD (gastroesophageal reflux disease)   UTI (lower urinary tract infection):  Presented with altered mental status, dysuria and urinalysis consistent with UTI. Started on cefepime in the emergency department will continue. We'll adjust antibiotics according to the culture results.  Acute encephalopathy:  Is likely multifactorial including UTI, dehydration and acute renal failure. Somnolence and sleepiness resolved, still confused which is likely chronic because of dementia.  Hyperkalemia:  Potassium 5.9 with mild T-wave peaking. This is due to AKI and continuation of potassium supplement  Hypokalemia Now hypokalemia, patient presented with hyperkalemia, received insulin and Kayexalate. Now has severe hypokalemia with potassium 2.4, give potassium supplements.  AKI:  Creatinine 4.90, BUN 112. No previous creatinine on record. Assuming this is acute renal injury. No hydronephrosis by CT scan. Likely due to prerenal secondary to dehydration and continuation of diruetics and NSAIDs  IV fluids, intake/output very challenging to follow as she is incontinent. Hold HCTZ and naproxen. Creatinine improved 3.8 today, continue IV fluids.  HLD: Last LDL was not on record -Continue home medications: Lipitor -Check FLP  Essential hypertension: -Hold HCTZ due to worsening renal function -IV hydralazine when necessary  Depression: stable -continue Paxil  GERD: -Pepcid  Decubitus ulcer: -Consult wound care team   Code  Status: DNR Family Communication: Plan discussed with the patient. Disposition Plan: Remains inpatient Diet: Diet Heart Room service appropriate?: Yes with Assist; Fluid consistency:: Thin  Consultants:  None  Procedures:  None  Antibiotics:  Cefepime   Objective: Filed Vitals:   09/05/15 2235 09/06/15 0513  BP: 93/38 103/43  Pulse: 90 81  Temp: 98.1 F (36.7 C) 98.2 F (36.8 C)  Resp: 18 18    Intake/Output Summary (Last 24 hours) at 09/06/15 1207 Last data filed at 09/06/15 1000  Gross per 24 hour  Intake   1820 ml  Output      1 ml  Net   1819 ml   Filed Weights   09/04/15 2050 09/04/15 2303  Weight: 63.504 kg (140 lb) 50.8 kg (111 lb 15.9 oz)    Exam: General: Alert and awake, oriented x3, not in any acute distress. HEENT: anicteric sclera, pupils reactive to light and accommodation, EOMI CVS: S1-S2 clear, no murmur rubs or gallops Chest: clear to auscultation bilaterally, no wheezing, rales or rhonchi Abdomen: soft nontender, nondistended, normal bowel sounds, no organomegaly Extremities: no cyanosis, clubbing or edema noted bilaterally Neuro: Cranial nerves II-XII intact, no focal neurological deficits  Data Reviewed: Basic Metabolic Panel:  Recent Labs Lab 09/04/15 1758 09/05/15 0451 09/06/15 0450  NA 132* 131* 138  K 5.9* 4.9 2.4*  CL 106 107 108  CO2 15* 12* 20*  GLUCOSE 123* 288* 100*  BUN 112* 121* 94*  CREATININE 4.90* 4.65* 3.89*  CALCIUM 9.8 9.1 8.4*  PHOS  --   --  4.3   Liver Function Tests:  Recent Labs Lab 09/04/15 1758 09/05/15 0451 09/06/15 0450  AST 37 28  --   ALT 24 20  --   ALKPHOS 92 70  --   BILITOT 1.0 0.8  --  PROT 7.1 5.6*  --   ALBUMIN 3.6 2.8* 2.7*   No results for input(s): LIPASE, AMYLASE in the last 168 hours. No results for input(s): AMMONIA in the last 168 hours. CBC:  Recent Labs Lab 09/04/15 1758 09/05/15 0451  WBC 10.5 8.5  NEUTROABS 8.7*  --   HGB 11.8* 10.7*  HCT 36.7 32.8*  MCV  90.2 91.4  PLT 484* 369   Cardiac Enzymes: No results for input(s): CKTOTAL, CKMB, CKMBINDEX, TROPONINI in the last 168 hours. BNP (last 3 results) No results for input(s): BNP in the last 8760 hours.  ProBNP (last 3 results) No results for input(s): PROBNP in the last 8760 hours.  CBG:  Recent Labs Lab 09/05/15 0738 09/06/15 0802  GLUCAP 133* 93    Micro Recent Results (from the past 240 hour(s))  Urine culture     Status: None   Collection Time: 09/04/15  5:54 PM  Result Value Ref Range Status   Specimen Description URINE, CATHETERIZED  Final   Special Requests NONE  Final   Culture   Final    MULTIPLE SPECIES PRESENT, SUGGEST RECOLLECTION Performed at [facility]    Report Status 09/06/2015 FINAL  Final  Blood Culture (routine x 2)     Status: None (Preliminary result)   Collection Time: 09/04/15 10:00 PM  Result Value Ref Range Status   Specimen Description BLOOD BLOOD LEFT HAND  Final   Special Requests IN PEDIATRIC BOTTLE  Final   Culture   Final    NO GROWTH 2 DAYS Performed at [facility]    Report Status PENDING  Incomplete  Blood Culture (routine x 2)     Status: None (Preliminary result)   Collection Time: 09/04/15 10:00 PM  Result Value Ref Range Status   Specimen Description BLOOD BLOOD LEFT FOREARM  Final   Special Requests IN PEDIATRIC BOTTLE  Final   Culture   Final    NO GROWTH 2 DAYS Performed at [facility]    Report Status PENDING  Incomplete  MRSA PCR Screening     Status: None   Collection Time: 09/05/15 12:38 AM  Result Value Ref Range Status   MRSA by PCR NEGATIVE NEGATIVE Final    Comment:        The GeneXpert MRSA Assay (FDA approved for NASAL specimens only), is one component of a comprehensive MRSA colonization surveillance program. It is not intended to diagnose MRSA infection nor to guide or monitor treatment for MRSA infections.      Studies: Ct Renal Stone Study  09/04/2015  CLINICAL  DATA:  Hematuria, multiple urinary tract infection recently. EXAM: CT ABDOMEN AND PELVIS WITHOUT CONTRAST TECHNIQUE: Multidetector CT imaging of the abdomen and pelvis was performed following the standard protocol without IV contrast. COMPARISON:  None. FINDINGS: Calcified granulomas are identified within the liver. No focal liver lesion is identified. There is question sludge in the gallbladder. There is no inflammation surrounding the gallbladder. Calcified granuloma is identified in the spleen. The spleen is otherwise normal. The pancreas is normal. The adrenal glands are normal. There is no nephrolithiasis or hydroureteronephrosis bilaterally. Atherosclerosis of the aorta is identified without aneurysmal dilatation. There is no abdominal lymphadenopathy. There is no small bowel obstruction or diverticulitis. The appendix is not seen but no inflammation is noted around the cecum. The bladder is partially decompressed. There is asymmetric right and posterior bladder wall thickening. The uterus is normal. The lung bases are clear. Chronic compression deformity  of L4 is identified. IMPRESSION: Asymmetric right and posterior bladder wall thickening. Further evaluation with direct with visualization on outpatient basis is recommended to exclude underlying neoplasm. No nephrolithiasis or hydroureteronephrosis bilaterally. Electronically Signed   By: Sherian Rein M.D.   On: 09/04/2015 20:53    Scheduled Meds: . atorvastatin  20 mg Oral Daily  . calcium-vitamin D  1 tablet Oral BID  . ceFEPime (MAXIPIME) IV  250 mg Intravenous Q24H  . clotrimazole   Topical BID  . famotidine  10 mg Oral Daily  . feeding supplement (ENSURE ENLIVE)  237 mL Oral BID BM  . heparin  5,000 Units Subcutaneous 3 times per day  . multivitamin with minerals  1 tablet Oral Daily  . PARoxetine  20 mg Oral Daily  . potassium chloride  40 mEq Oral Q6H  . sodium chloride flush  3 mL Intravenous Q12H   Continuous Infusions: . sodium  chloride Stopped (09/06/15 0739)       Time spent: 35 minutes    [facility] A  [facility] Pager [phone] If 7PM-7AM, please contact night-coverage at [email or web address], password [facility] 09/06/2015, 12:07 PM  LOS: 2 days

## 2015-09-07 LAB — RENAL FUNCTION PANEL
ANION GAP: 7 (ref 5–15)
Albumin: 2.5 g/dL — ABNORMAL LOW (ref 3.5–5.0)
BUN: 77 mg/dL — ABNORMAL HIGH (ref 6–20)
CALCIUM: 8.5 mg/dL — AB (ref 8.9–10.3)
CO2: 17 mmol/L — ABNORMAL LOW (ref 22–32)
CREATININE: 3.38 mg/dL — AB (ref 0.44–1.00)
Chloride: 113 mmol/L — ABNORMAL HIGH (ref 101–111)
GFR, EST AFRICAN AMERICAN: 13 mL/min — AB (ref >60)
GFR, EST NON AFRICAN AMERICAN: 12 mL/min — AB (ref >60)
Glucose, Bld: 95 mg/dL (ref 65–99)
Phosphorus: 1.8 mg/dL — ABNORMAL LOW (ref 2.5–4.6)
Potassium: 3.9 mmol/L (ref 3.5–5.1)
SODIUM: 137 mmol/L (ref 135–145)

## 2015-09-07 LAB — MAGNESIUM: MAGNESIUM: 2.5 mg/dL — AB (ref 1.7–2.4)

## 2015-09-07 LAB — GLUCOSE, CAPILLARY: GLUCOSE-CAPILLARY: 94 mg/dL (ref 65–99)

## 2015-09-07 MED ORDER — POLYETHYLENE GLYCOL 3350 17 G PO PACK: 17.0000 g | PACK | Freq: Every day | ORAL | Status: DC

## 2015-09-07 MED ORDER — POTASSIUM CHLORIDE CRYS ER 20 MEQ PO TBCR
40.0000 meq | EXTENDED_RELEASE_TABLET | Freq: Once | ORAL | Status: AC
  Administered 2015-09-07: 40 meq via ORAL
  Filled 2015-09-07: qty 2

## 2015-09-07 MED ORDER — K PHOS MONO-SOD PHOS DI & MONO 155-852-130 MG PO TABS
500.0000 mg | ORAL_TABLET | Freq: Two times a day (BID) | ORAL | Status: DC
  Administered 2015-09-07 – 2015-09-08 (×2): 500 mg via ORAL
  Filled 2015-09-07 (×4): qty 2

## 2015-09-07 MED ORDER — POLYETHYLENE GLYCOL 3350 17 G PO PACK: 17.0000 g | PACK | Freq: Every day | ORAL | Status: DC | PRN

## 2015-09-07 NOTE — Progress Notes (Signed)
Pharmacy Antibiotic Note  Caitlin Wilkins is a 82 y.o. female admitted on 09/04/2015 with UTI.  Patient presents with AMS from an assisted living facility and has been treated for a UTI for the past two weeks per MD note.  Antibiotic taken not reported.  Pharmacy has been consulted for Cefepime dosing.  Unsure of baseline renal status but admission SCr 4.90.  CrCl<10 ml/min. SCr continues to improved but CrCl remains < 61ml/min.  Urine culture with multiple species.   Day #4 cefepime  Plan: Continue Cefepime 250 mg IV q24h. F/u SCr for dose adjustments. Consider changing to oral antibiotic, such as cefuroxome or cefodizime   Height:  (157.5 cm) Weight: 111 lb 15.9 oz (50.8 kg) IBW/kg (Calculated) : 50.1  Temp (24hrs), Avg:97.8 F (36.6 C), Min:97.3 F (36.3 C), Max:98 F (36.7 C)   Recent Labs Lab 09/04/15 1758 09/04/15 1810 09/05/15 0451 09/06/15 0450 09/07/15 0500  WBC 10.5  --  8.5  --   --   CREATININE 4.90*  --  4.65* 3.89* 3.38*  LATICACIDVEN  --  1.90  --   --   --     Estimated Creatinine Clearance: 9.8 mL/min (by C-G formula based on Cr of 3.38).    Allergies  Allergen Reactions  . Baclofen     Heart racing  . Oxycodone-Aspirin Nausea And Vomiting  . Sulfasalazine     Antimicrobials this admission: 2/23 Cefepime >>   Dose adjustments this admission: -  Microbiology results: 2/22 MRSA PCR (+) on chlx and bactroban 2/22 bcx x2: CoNS 1/2 likely contaminant 2/23 peritoneal fluid cx: e. Coli (R to cipro only)  Thank you for allowing pharmacy to be a part of this patient's care.  Juliette Alcide, PharmD, BCPS.   Pager: [phone] 09/07/2015 12:33 PM

## 2015-09-07 NOTE — Progress Notes (Signed)
[facility] PROGRESS NOTE   Caitlin Wilkins ZOX:[id number] DOB: April 21, 1929 DOA: 09/04/2015 PCP: No primary care provider on file.  HPI/Subjective: Seen with nursing staff at bedside, pleasantly confused. Does not have any fever or chills overnight.  Assessment/Plan: Principal Problem:   UTI (lower urinary tract infection) Active Problems:   HLD (hyperlipidemia)   Essential hypertension   Acute encephalopathy   Decubitus ulcer   Hyperkalemia   AKI (acute kidney injury) (HCC)   Depression   GERD (gastroesophageal reflux disease)   UTI (lower urinary tract infection):  Presented with somnolence and more confusion on top of her chronic dementia, dysuria and urinalysis consistent with UTI. Started on cefepime in the emergency department will continue. Urine culture showed multiple morphotypes, urine was strongly positive on admission continue antibiotics. Likely she will need oral antibiotics and discharged to complete 7-10 days of antibiotics.  Acute renal failure Creatinine 4.90, BUN 112. No previous creatinine on record. Assuming this is acute renal injury. No hydronephrosis by CT scan. Likely due to prerenal secondary to dehydration and continuation of diruetics and NSAIDs  IV fluids, intake/output very challenging to follow as she is incontinent. Hold HCTZ and naproxen. Creatinine improved 3.3 today, continue IV fluids, check BMP in a.m.  Acute encephalopathy:  Is likely multifactorial including UTI, dehydration and acute renal failure. Somnolence and sleepiness resolved, still confused which is likely chronic because of dementia. Likely at baseline.  Hyperkalemia:  Initially presented with hyperkalemia with Potassium 5.9 with mild T-wave peaking.  This is due to AKI and continuation of potassium supplement  Hypokalemia Now hypokalemia, patient presented with hyperkalemia, received insulin and Kayexalate. Now has severe hypokalemia with potassium 2.4, give  potassium supplements.  HLD: Last LDL was not on record -Continue home medications: Lipitor -Check FLP  Essential hypertension: -Hold HCTZ due to worsening renal function -IV hydralazine when necessary  Depression: stable -continue Paxil  GERD: -Pepcid  Decubitus ulcer: -Consult wound care team  Hypophosphatemia Replete parenterally   Code Status: DNR Family Communication: Plan discussed with the patient. Disposition Plan: Deconditioned, need PT/OT, likely to nursing home 2 days or whenever the renal function improves. Diet: Diet Heart Room service appropriate?: Yes with Assist; Fluid consistency:: Thin  Consultants:  None  Procedures:  None  Antibiotics:  Cefepime   Objective: Filed Vitals:   09/06/15 2125 09/07/15 0354  BP: 113/43 110/53  Pulse: 87 85  Temp: 98 F (36.7 C) 98 F (36.7 C)  Resp: 18 18    Intake/Output Summary (Last 24 hours) at 09/07/15 1131 Last data filed at 09/07/15 0805  Gross per 24 hour  Intake 2173.75 ml  Output      0 ml  Net 2173.75 ml   Filed Weights   09/04/15 2050 09/04/15 2303  Weight: 63.504 kg (140 lb) 50.8 kg (111 lb 15.9 oz)    Exam: General: Alert and awake, oriented x3, not in any acute distress. HEENT: anicteric sclera, pupils reactive to light and accommodation, EOMI CVS: S1-S2 clear, no murmur rubs or gallops Chest: clear to auscultation bilaterally, no wheezing, rales or rhonchi Abdomen: soft nontender, nondistended, normal bowel sounds, no organomegaly Extremities: no cyanosis, clubbing or edema noted bilaterally Neuro: Cranial nerves II-XII intact, no focal neurological deficits  Data Reviewed: Basic Metabolic Panel:  Recent Labs Lab 09/04/15 1758 09/05/15 0451 09/06/15 0450 09/07/15 0500 09/07/15 0501  NA 132* 131* 138 137  --   K 5.9* 4.9 2.4* 3.9  --   CL 106 107 108 113*  --  CO2 15* 12* 20* 17*  --   GLUCOSE 123* 288* 100* 95  --   BUN 112* 121* 94* 77*  --   CREATININE 4.90*  4.65* 3.89* 3.38*  --   CALCIUM 9.8 9.1 8.4* 8.5*  --   MG  --   --   --   --  2.5*  PHOS  --   --  4.3 1.8*  --    Liver Function Tests:  Recent Labs Lab 09/04/15 1758 09/05/15 0451 09/06/15 0450 09/07/15 0500  AST 37 28  --   --   ALT 24 20  --   --   ALKPHOS 92 70  --   --   BILITOT 1.0 0.8  --   --   PROT 7.1 5.6*  --   --   ALBUMIN 3.6 2.8* 2.7* 2.5*   No results for input(s): LIPASE, AMYLASE in the last 168 hours. No results for input(s): AMMONIA in the last 168 hours. CBC:  Recent Labs Lab 09/04/15 1758 09/05/15 0451  WBC 10.5 8.5  NEUTROABS 8.7*  --   HGB 11.8* 10.7*  HCT 36.7 32.8*  MCV 90.2 91.4  PLT 484* 369   Cardiac Enzymes: No results for input(s): CKTOTAL, CKMB, CKMBINDEX, TROPONINI in the last 168 hours. BNP (last 3 results) No results for input(s): BNP in the last 8760 hours.  ProBNP (last 3 results) No results for input(s): PROBNP in the last 8760 hours.  CBG:  Recent Labs Lab 09/05/15 0738 09/06/15 0802 09/07/15 0729  GLUCAP 133* 93 94    Micro Recent Results (from the past 240 hour(s))  Urine culture     Status: None   Collection Time: 09/04/15  5:54 PM  Result Value Ref Range Status   Specimen Description URINE, CATHETERIZED  Final   Special Requests NONE  Final   Culture   Final    MULTIPLE SPECIES PRESENT, SUGGEST RECOLLECTION Performed at [facility]    Report Status 09/06/2015 FINAL  Final  Blood Culture (routine x 2)     Status: None (Preliminary result)   Collection Time: 09/04/15 10:00 PM  Result Value Ref Range Status   Specimen Description BLOOD BLOOD LEFT HAND  Final   Special Requests IN PEDIATRIC BOTTLE  Final   Culture   Final    NO GROWTH 2 DAYS Performed at [facility]    Report Status PENDING  Incomplete  Blood Culture (routine x 2)     Status: None (Preliminary result)   Collection Time: 09/04/15 10:00 PM  Result Value Ref Range Status   Specimen Description BLOOD BLOOD LEFT FOREARM   Final   Special Requests IN PEDIATRIC BOTTLE  Final   Culture   Final    NO GROWTH 2 DAYS Performed at [facility]    Report Status PENDING  Incomplete  MRSA PCR Screening     Status: None   Collection Time: 09/05/15 12:38 AM  Result Value Ref Range Status   MRSA by PCR NEGATIVE NEGATIVE Final    Comment:        The GeneXpert MRSA Assay (FDA approved for NASAL specimens only), is one component of a comprehensive MRSA colonization surveillance program. It is not intended to diagnose MRSA infection nor to guide or monitor treatment for MRSA infections.      Studies: No results found.  Scheduled Meds: . atorvastatin  20 mg Oral Daily  . calcium-vitamin D  1 tablet Oral BID  .  ceFEPime (MAXIPIME) IV  250 mg Intravenous Q24H  . clotrimazole   Topical BID  . famotidine  10 mg Oral Daily  . feeding supplement (ENSURE ENLIVE)  237 mL Oral BID BM  . heparin  5,000 Units Subcutaneous 3 times per day  . multivitamin with minerals  1 tablet Oral Daily  . PARoxetine  20 mg Oral Daily  . sodium chloride flush  3 mL Intravenous Q12H   Continuous Infusions: . sodium chloride 75 mL/hr at 09/07/15 1124       Time spent: 35 minutes    [facility] A  [facility] Pager [phone] If 7PM-7AM, please contact night-coverage at [email or web address], password [facility] 09/07/2015, 11:31 AM  LOS: 3 days

## 2015-09-07 NOTE — Progress Notes (Signed)
Pt is from [facility]- PT recommending pt transition to SNF portion of [facility] for rehab- CSW completed fl2 and attempted to call family to confirm plan for transition to SNF- unable to leave a voicemail  CSW will continue to follow  Merlyn Lot, [facility] Clinical Social Worker [phone]

## 2015-09-07 NOTE — [place] FL2 (Signed)
[place] MEDICAID FL2 LEVEL OF CARE SCREENING TOOL     IDENTIFICATION  Patient Name: Caitlin Wilkins Birthdate: 1928/10/27 Sex: female Admission Date (Current Location): 09/04/2015  [facility] and [place] Number:  Producer, television/film/video and Address:  [facility], [address], [place], [place] [address]      Provider Number: [id number]  Attending Physician Name and Address:  Clydia Llano, MD  Relative Name and Phone Number:       Current Level of Care: Hospital Recommended Level of Care: Skilled Nursing Facility Prior Approval Number:    Date Approved/Denied:   PASRR Number: [id number]  Discharge Plan: SNF    Current Diagnoses: Patient Active Problem List   Diagnosis Date Noted  . UTI (lower urinary tract infection) 09/04/2015  . Acute encephalopathy 09/04/2015  . Decubitus ulcer 09/04/2015  . Hyperkalemia 09/04/2015  . AKI (acute kidney injury) (HCC) 09/04/2015  . Depression   . GERD (gastroesophageal reflux disease)   . HLD (hyperlipidemia) 01/13/2009  . Essential hypertension 01/13/2009  . UPPER RESPIRATORY INFECTION, VIRAL 01/13/2009  . DERMATITIS 01/13/2009    Orientation RESPIRATION BLADDER Height & Weight     Self  Normal Incontinent Weight: 111 lb 15.9 oz (50.8 kg) Height:   (157.5 cm)  BEHAVIORAL SYMPTOMS/MOOD NEUROLOGICAL BOWEL NUTRITION STATUS      Continent Diet (cardiac)  AMBULATORY STATUS COMMUNICATION OF NEEDS Skin   Extensive Assist Verbally PU Stage and Appropriate Care PU Stage 1 Dressing:  (located on heel) PU Stage 2 Dressing:  (located on buttocks)                   Personal Care Assistance Level of Assistance  Bathing, Dressing Bathing Assistance: Maximum assistance   Dressing Assistance: Maximum assistance     Functional Limitations Info             SPECIAL CARE FACTORS FREQUENCY  PT (By licensed PT), OT (By licensed OT)     PT Frequency: 5/wk OT Frequency: 5/wk             Contractures      Additional Factors Info  Code Status, Allergies Code Status Info: DNR Allergies Info: Baclofen, Oxycodone-aspirin, Sulfasalazine           Current Medications (09/07/2015):  This is the current hospital active medication list Current Facility-Administered Medications  Medication Dose Route Frequency Provider Last Rate Last Dose  . 0.9 %  sodium chloride infusion   Intravenous Continuous Lorretta Harp, MD 75 mL/hr at 09/07/15 1124    . acetaminophen (TYLENOL) tablet 650 mg  650 mg Oral Q6H PRN Lorretta Harp, MD   650 mg at 09/07/15 3474   Or  . acetaminophen (TYLENOL) suppository 650 mg  650 mg Rectal Q6H PRN Lorretta Harp, MD      . atorvastatin (LIPITOR) tablet 20 mg  20 mg Oral Daily Lorretta Harp, MD   20 mg at 09/06/15 1738  . calcium-vitamin D (OSCAL WITH D) 500-200 MG-UNIT per tablet 1 tablet  1 tablet Oral BID Lorretta Harp, MD   1 tablet at 09/07/15 0920  . ceFEPIme (MAXIPIME) 250 mg in dextrose 5 % 50 mL IVPB  250 mg Intravenous Q24H Teressa Lower, RPH   250 mg at 09/06/15 2237  . clotrimazole (LOTRIMIN) 1 % cream   Topical BID Lorretta Harp, MD      . famotidine (PEPCID) tablet 10 mg  10 mg Oral Daily Lorretta Harp, MD   10 mg at  09/07/15 0920  . feeding supplement (ENSURE ENLIVE) (ENSURE ENLIVE) liquid 237 mL  237 mL Oral BID BM Mutaz Elmahi, MD   237 mL at 09/07/15 1307  . heparin injection 5,000 Units  5,000 Units Subcutaneous 3 times per day Lorretta Harp, MD   5,000 Units at 09/07/15 1309  . hydrALAZINE (APRESOLINE) injection 5 mg  5 mg Intravenous Q2H PRN Lorretta Harp, MD      . multivitamin with minerals tablet 1 tablet  1 tablet Oral Daily Lorretta Harp, MD   1 tablet at 09/07/15 0920  . ondansetron (ZOFRAN) tablet 4 mg  4 mg Oral Q6H PRN Lorretta Harp, MD       Or  . ondansetron [facility]) injection 4 mg  4 mg Intravenous Q6H PRN Lorretta Harp, MD      . PARoxetine (PAXIL) tablet 20 mg  20 mg Oral Daily Lorretta Harp, MD   20 mg at 09/07/15 0920  . phosphorus (K PHOS NEUTRAL) tablet 500 mg  500  mg Oral BID Clydia Llano, MD   500 mg at 09/07/15 1205  . polyethylene glycol (MIRALAX / GLYCOLAX) packet 17 g  17 g Oral Daily PRN Clydia Llano, MD      . sodium chloride flush (NS) 0.9 % injection 3 mL  3 mL Intravenous Q12H Lorretta Harp, MD   3 mL at 09/07/15 0920     Discharge Medications: Please see discharge summary for a list of discharge medications.  Relevant Imaging Results:  Relevant Lab Results:   Additional Information SS#: [id number]  Izora Ribas, [place]

## 2015-09-08 LAB — RENAL FUNCTION PANEL
Albumin: 2.5 g/dL — ABNORMAL LOW (ref 3.5–5.0)
Anion gap: 8 (ref 5–15)
BUN: 64 mg/dL — AB (ref 6–20)
CHLORIDE: 117 mmol/L — AB (ref 101–111)
CO2: 18 mmol/L — AB (ref 22–32)
CREATININE: 3.08 mg/dL — AB (ref 0.44–1.00)
Calcium: 8.4 mg/dL — ABNORMAL LOW (ref 8.9–10.3)
GFR calc Af Amer: 15 mL/min — ABNORMAL LOW (ref >60)
GFR, EST NON AFRICAN AMERICAN: 13 mL/min — AB (ref >60)
Glucose, Bld: 99 mg/dL (ref 65–99)
Phosphorus: 1.8 mg/dL — ABNORMAL LOW (ref 2.5–4.6)
Potassium: 4.3 mmol/L (ref 3.5–5.1)
Sodium: 143 mmol/L (ref 135–145)

## 2015-09-08 LAB — GLUCOSE, CAPILLARY: Glucose-Capillary: 83 mg/dL (ref 65–99)

## 2015-09-08 MED ORDER — K PHOS MONO-SOD PHOS DI & MONO 155-852-130 MG PO TABS
500.0000 mg | ORAL_TABLET | Freq: Two times a day (BID) | ORAL | Status: AC
  Administered 2015-09-08: 500 mg via ORAL
  Filled 2015-09-08: qty 2

## 2015-09-08 NOTE — Consult Note (Signed)
WOC wound consult note Reason for Consult:Reconsulted due to the presence of heel dressings.  Patient is refusing to wear Prevalon boots, heels are offloaded with a pillow under the calves and silicone border foam heel dressings.  MASD to buttocks is much improved.  Turning and repositioning and keeping skin clean and dry.   Wound type:MASD to buttocks and bilateral ischial tuberosity.  Nonblanchable redness to heels.  Pressure Ulcer POA: No Dressing procedure/placement/frequency:Continue previous orders. Will not follow at this time.  Please re-consult if needed.  Maple Hudson RN BSN CWON Pager [phone]

## 2015-09-08 NOTE — Progress Notes (Signed)
CSW reviewed PT evaluation recommending SNF for patient at discharge, patient was living at Independent Living at [facility]. CSW spoke with patient's son, Lorin Picket (ph#: [phone]) who informed CSW that he has made arrangements for patient to return to Independent Living with 24 hour care. Son to call CSW back with home care agency she's used in the past. RNCM, Cookie aware.   No further CSW needs identified - CSW signing off.   Lincoln Maxin, LCSW [facility] Clinical Social Worker cell #: [phone]

## 2015-09-08 NOTE — Progress Notes (Signed)
[facility] PROGRESS NOTE   Caitlin Wilkins FAO:[id number] DOB: 12-03-1928 DOA: 09/04/2015 PCP: No primary care provider on file.  HPI/Subjective: Alert , pleasantly confuse.  Had BM per nurse report.    Assessment/Plan: Principal Problem:   UTI (lower urinary tract infection) Active Problems:   HLD (hyperlipidemia)   Essential hypertension   Acute encephalopathy   Decubitus ulcer   Hyperkalemia   AKI (acute kidney injury) (HCC)   Depression   GERD (gastroesophageal reflux disease)   UTI (lower urinary tract infection):  Presented with somnolence and more confusion on top of her chronic dementia, dysuria and urinalysis consistent with UTI. Started on cefepime in the emergency department will continue. Urine culture showed multiple morphotypes, urine was strongly positive on admission continue antibiotics. Likely she will need oral antibiotics and discharged to complete 7-10 days of antibiotics.  Acute renal failure Creatinine 4.90, BUN 112. No previous creatinine on record. Assuming this is acute renal injury. No hydronephrosis by CT scan. Likely due to prerenal secondary to dehydration and continuation of diruetics and NSAIDs  IV fluids, intake/output very challenging to follow as she is incontinent. Hold HCTZ and naproxen. Creatinine improved 3.0 today  continue IV fluids, check BMP in a.m.  Acute encephalopathy:  Is likely multifactorial including UTI, dehydration and acute renal failure. Somnolence and sleepiness resolved, still confused which is likely chronic because of dementia. Likely at baseline.  Hyperkalemia:  Initially presented with hyperkalemia with Potassium 5.9 with mild T-wave peaking.  This is due to AKI and continuation of potassium supplement Resolved.   Hypokalemia Resolved.   HLD: Last LDL was not on record -Continue home medications: Lipitor - FLP, LDL 53  Essential hypertension: -Hold HCTZ due to worsening renal function -IV  hydralazine when necessary  Depression: stable -continue Paxil  GERD: -Pepcid  Decubitus ulcer: -Consult wound care team  Hypophosphatemia Replete parenterally. Repeat level in am.    Code Status: DNR Family Communication: Plan discussed with the patient. Disposition Plan: Deconditioned, need PT/OT, likely to nursing home 2 days or whenever the renal function improves. Diet: Diet Heart Room service appropriate?: Yes with Assist; Fluid consistency:: Thin  Consultants:  None  Procedures:  None  Antibiotics:  Cefepime   Objective: Filed Vitals:   09/07/15 2200 09/08/15 0501  BP: 128/50 134/58  Pulse: 80 85  Temp: 98 F (36.7 C) 97.8 F (36.6 C)  Resp: 18 18    Intake/Output Summary (Last 24 hours) at 09/08/15 1144 Last data filed at 09/08/15 1000  Gross per 24 hour  Intake   2720 ml  Output      0 ml  Net   2720 ml   Filed Weights   09/04/15 2050 09/04/15 2303  Weight: 63.504 kg (140 lb) 50.8 kg (111 lb 15.9 oz)    Exam: General:not in any acute distress. HEENT: anicteric sclera, pupils reactive to light and accommodation, EOMI CVS: S1-S2 clear, no murmur rubs or gallops Chest: clear to auscultation bilaterally, no wheezing, rales or rhonchi Abdomen: soft nontender, nondistended, normal bowel sounds, no organomegaly Extremities: no cyanosis, clubbing or edema noted bilaterally Neuro: alert, pleasantly confuse.   Data Reviewed: Basic Metabolic Panel:  Recent Labs Lab 09/04/15 1758 09/05/15 0451 09/06/15 0450 09/07/15 0500 09/07/15 0501 09/08/15 0437  NA 132* 131* 138 137  --  143  K 5.9* 4.9 2.4* 3.9  --  4.3  CL 106 107 108 113*  --  117*  CO2 15* 12* 20* 17*  --  18*  GLUCOSE 123*  288* 100* 95  --  99  BUN 112* 121* 94* 77*  --  64*  CREATININE 4.90* 4.65* 3.89* 3.38*  --  3.08*  CALCIUM 9.8 9.1 8.4* 8.5*  --  8.4*  MG  --   --   --   --  2.5*  --   PHOS  --   --  4.3 1.8*  --  1.8*   Liver Function Tests:  Recent Labs Lab  09/04/15 1758 09/05/15 0451 09/06/15 0450 09/07/15 0500 09/08/15 0437  AST 37 28  --   --   --   ALT 24 20  --   --   --   ALKPHOS 92 70  --   --   --   BILITOT 1.0 0.8  --   --   --   PROT 7.1 5.6*  --   --   --   ALBUMIN 3.6 2.8* 2.7* 2.5* 2.5*   No results for input(s): LIPASE, AMYLASE in the last 168 hours. No results for input(s): AMMONIA in the last 168 hours. CBC:  Recent Labs Lab 09/04/15 1758 09/05/15 0451  WBC 10.5 8.5  NEUTROABS 8.7*  --   HGB 11.8* 10.7*  HCT 36.7 32.8*  MCV 90.2 91.4  PLT 484* 369   Cardiac Enzymes: No results for input(s): CKTOTAL, CKMB, CKMBINDEX, TROPONINI in the last 168 hours. BNP (last 3 results) No results for input(s): BNP in the last 8760 hours.  ProBNP (last 3 results) No results for input(s): PROBNP in the last 8760 hours.  CBG:  Recent Labs Lab 09/05/15 0738 09/06/15 0802 09/07/15 0729 09/08/15 0803  GLUCAP 133* 93 94 83    Micro Recent Results (from the past 240 hour(s))  Urine culture     Status: None   Collection Time: 09/04/15  5:54 PM  Result Value Ref Range Status   Specimen Description URINE, CATHETERIZED  Final   Special Requests NONE  Final   Culture   Final    MULTIPLE SPECIES PRESENT, SUGGEST RECOLLECTION Performed at [facility]    Report Status 09/06/2015 FINAL  Final  Blood Culture (routine x 2)     Status: None (Preliminary result)   Collection Time: 09/04/15 10:00 PM  Result Value Ref Range Status   Specimen Description BLOOD BLOOD LEFT HAND  Final   Special Requests IN PEDIATRIC BOTTLE  Final   Culture   Final    NO GROWTH 3 DAYS Performed at [facility]    Report Status PENDING  Incomplete  Blood Culture (routine x 2)     Status: None (Preliminary result)   Collection Time: 09/04/15 10:00 PM  Result Value Ref Range Status   Specimen Description BLOOD BLOOD LEFT FOREARM  Final   Special Requests IN PEDIATRIC BOTTLE  Final   Culture   Final    NO GROWTH 3  DAYS Performed at [facility]    Report Status PENDING  Incomplete  MRSA PCR Screening     Status: None   Collection Time: 09/05/15 12:38 AM  Result Value Ref Range Status   MRSA by PCR NEGATIVE NEGATIVE Final    Comment:        The GeneXpert MRSA Assay (FDA approved for NASAL specimens only), is one component of a comprehensive MRSA colonization surveillance program. It is not intended to diagnose MRSA infection nor to guide or monitor treatment for MRSA infections.      Studies: No results found.  Scheduled Meds: .  atorvastatin  20 mg Oral Daily  . calcium-vitamin D  1 tablet Oral BID  . ceFEPime (MAXIPIME) IV  250 mg Intravenous Q24H  . clotrimazole   Topical BID  . famotidine  10 mg Oral Daily  . feeding supplement (ENSURE ENLIVE)  237 mL Oral BID BM  . heparin  5,000 Units Subcutaneous 3 times per day  . multivitamin with minerals  1 tablet Oral Daily  . PARoxetine  20 mg Oral Daily  . phosphorus  500 mg Oral BID  . sodium chloride flush  3 mL Intravenous Q12H   Continuous Infusions: . sodium chloride 75 mL/hr at 09/08/15 0131       Time spent: 25 minutes    Caitlin Wilkins A  [facility] Pager [phone] If 7PM-7AM, please contact night-coverage at [email or web address], password [facility] 09/08/2015, 11:44 AM  LOS: 4 days

## 2015-09-09 LAB — RENAL FUNCTION PANEL
ALBUMIN: 2.3 g/dL — AB (ref 3.5–5.0)
Anion gap: 5 (ref 5–15)
BUN: 51 mg/dL — ABNORMAL HIGH (ref 6–20)
CHLORIDE: 120 mmol/L — AB (ref 101–111)
CO2: 19 mmol/L — ABNORMAL LOW (ref 22–32)
Calcium: 8.4 mg/dL — ABNORMAL LOW (ref 8.9–10.3)
Creatinine, Ser: 2.53 mg/dL — ABNORMAL HIGH (ref 0.44–1.00)
GFR, EST AFRICAN AMERICAN: 19 mL/min — AB (ref >60)
GFR, EST NON AFRICAN AMERICAN: 16 mL/min — AB (ref >60)
Glucose, Bld: 99 mg/dL (ref 65–99)
PHOSPHORUS: 2.9 mg/dL (ref 2.5–4.6)
POTASSIUM: 4.1 mmol/L (ref 3.5–5.1)
Sodium: 144 mmol/L (ref 135–145)

## 2015-09-09 LAB — GLUCOSE, CAPILLARY: Glucose-Capillary: 88 mg/dL (ref 65–99)

## 2015-09-09 LAB — CULTURE, BLOOD (ROUTINE X 2)
Culture: NO GROWTH
Culture: NO GROWTH

## 2015-09-09 LAB — CBC
HEMATOCRIT: 28.9 % — AB (ref 36.0–46.0)
Hemoglobin: 9.2 g/dL — ABNORMAL LOW (ref 12.0–15.0)
MCH: 29.6 pg (ref 26.0–34.0)
MCHC: 31.8 g/dL (ref 30.0–36.0)
MCV: 92.9 fL (ref 78.0–100.0)
PLATELETS: 321 10*3/uL (ref 150–400)
RBC: 3.11 MIL/uL — AB (ref 3.87–5.11)
RDW: 17.8 % — AB (ref 11.5–15.5)
WBC: 6.9 10*3/uL (ref 4.0–10.5)

## 2015-09-09 LAB — PHOSPHORUS: PHOSPHORUS: 2.7 mg/dL (ref 2.5–4.6)

## 2015-09-09 MED ORDER — AMOXICILLIN-POT CLAVULANATE 500-125 MG PO TABS
500.0000 mg | ORAL_TABLET | Freq: Two times a day (BID) | ORAL | Status: DC
  Administered 2015-09-09 – 2015-09-10 (×2): 500 mg via ORAL
  Filled 2015-09-09 (×3): qty 1

## 2015-09-09 MED ORDER — AMOXICILLIN-POT CLAVULANATE 875-125 MG PO TABS
1.0000 | ORAL_TABLET | Freq: Two times a day (BID) | ORAL | Status: DC
  Administered 2015-09-09: 1 via ORAL
  Filled 2015-09-09: qty 1

## 2015-09-09 MED ORDER — SODIUM CHLORIDE 0.9 % IV BOLUS (SEPSIS)
250.0000 mL | Freq: Once | INTRAVENOUS | Status: AC
  Administered 2015-09-09: 250 mL via INTRAVENOUS

## 2015-09-09 MED ORDER — CEPHALEXIN 500 MG PO CAPS: 500.0000 mg | ORAL_CAPSULE | Freq: Three times a day (TID) | ORAL | Status: DC

## 2015-09-09 NOTE — Progress Notes (Signed)
I agree with the previous nurse's assessment 

## 2015-09-09 NOTE — Care Management Important Message (Signed)
Important Message  Patient Details  Name: Kalin Amrhein MRN: [id number] Date of Birth: 11-23-1928   Medicare Important Message Given:  Yes    Haskell Flirt 09/09/2015, 12:48 PMImportant Message  Patient Details  Name: Aziyah Provencal MRN: [id number] Date of Birth: 1929-03-30   Medicare Important Message Given:  Yes    Haskell Flirt 09/09/2015, 12:47 PM

## 2015-09-09 NOTE — Progress Notes (Addendum)
[facility] PROGRESS NOTE   Caitlin Wilkins JXB:[id number] DOB: June 25, 1929 DOA: 09/04/2015 PCP: No primary care provider on file.  HPI/Subjective:  patient pleasantly confused, denies any chest pain or shortness of breath   Assessment/Plan: Principal Problem:   UTI (lower urinary tract infection) Active Problems:   HLD (hyperlipidemia)   Essential hypertension   Acute encephalopathy   Decubitus ulcer   Hyperkalemia   AKI (acute kidney injury) (HCC)   Depression   GERD (gastroesophageal reflux disease)   Assessment and plan UTI (lower urinary tract infection):  Presented with somnolence and more confusion on top of her chronic dementia, dysuria and urinalysis consistent with UTI.  currently on cefepime, switched to Augmentin 5 days and DC IV antibiotics Urine culture showed multiple morphotypes, urine was strongly positive on admission continue antibiotics.   Hyperglycemia Blood sugar 288 on 2/24, will check hemoglobin A1c  Acute renal failure Creatinine 4.90, BUN 112. No previous creatinine on record. Assuming this is acute renal injury. No hydronephrosis by CT scan. Likely due to prerenal secondary to dehydration and continuation of diruetics and NSAIDs  IV fluids, intake/output very challenging to follow as she is incontinent. Hold HCTZ and naproxen. Creatinine 2.53 today  continue IV fluids, check BMP in a.m.  Acute encephalopathy:  Is likely multifactorial including UTI, dehydration and acute renal failure. Somnolence and sleepiness resolved, still confused which is likely chronic because of dementia. Likely at baseline.  Hyperkalemia:  Initially presented with hyperkalemia with Potassium 5.9 with mild T-wave peaking.   potassium 4.1, resolved  Hypokalemia Resolved.   HLD: Last LDL was not on record -Continue home medications: Lipitor - FLP, LDL 53  Essential hypertension: -Hold HCTZ due to worsening renal function -IV hydralazine when  necessary  Depression: stable -continue Paxil  GERD: -Pepcid  Decubitus ulcer: -Consult wound care team  Hypophosphatemia Replete parenterally. Repeat level in am.    Code Status: DNR Family Communication: Plan discussed with the patient. Disposition Plan: Deconditioned, need PT/OT, likely to nursing home  Tomorrow whenever renal function improves Diet: Diet Heart Room service appropriate?: Yes with Assist; Fluid consistency:: Thin  Consultants:  None  Procedures:  None  Antibiotics:  Cefepime   Objective: Filed Vitals:   09/08/15 2124 09/09/15 0406  BP: 132/53 138/52  Pulse: 88 86  Temp: 97.6 F (36.4 C) 97.8 F (36.6 C)  Resp: 20 18    Intake/Output Summary (Last 24 hours) at 09/09/15 1006 Last data filed at 09/09/15 0900  Gross per 24 hour  Intake   2580 ml  Output      1 ml  Net   2579 ml   Filed Weights   09/04/15 2050 09/04/15 2303  Weight: 63.504 kg (140 lb) 50.8 kg (111 lb 15.9 oz)    Exam: General:not in any acute distress. HEENT: anicteric sclera, pupils reactive to light and accommodation, EOMI CVS: S1-S2 clear, no murmur rubs or gallops Chest: clear to auscultation bilaterally, no wheezing, rales or rhonchi Abdomen: soft nontender, nondistended, normal bowel sounds, no organomegaly Extremities: no cyanosis, clubbing or edema noted bilaterally Neuro: alert, pleasantly confuse.   Data Reviewed: Basic Metabolic Panel:  Recent Labs Lab 09/05/15 0451 09/06/15 0450 09/07/15 0500 09/07/15 0501 09/08/15 0437 09/09/15 0414  NA 131* 138 137  --  143 144  K 4.9 2.4* 3.9  --  4.3 4.1  CL 107 108 113*  --  117* 120*  CO2 12* 20* 17*  --  18* 19*  GLUCOSE 288* 100* 95  --  99 99  BUN 121* 94* 77*  --  64* 51*  CREATININE 4.65* 3.89* 3.38*  --  3.08* 2.53*  CALCIUM 9.1 8.4* 8.5*  --  8.4* 8.4*  MG  --   --   --  2.5*  --   --   PHOS  --  4.3 1.8*  --  1.8* 2.9  2.7   Liver Function Tests:  Recent Labs Lab 09/04/15 1758  09/05/15 0451 09/06/15 0450 09/07/15 0500 09/08/15 0437 09/09/15 0414  AST 37 28  --   --   --   --   ALT 24 20  --   --   --   --   ALKPHOS 92 70  --   --   --   --   BILITOT 1.0 0.8  --   --   --   --   PROT 7.1 5.6*  --   --   --   --   ALBUMIN 3.6 2.8* 2.7* 2.5* 2.5* 2.3*   No results for input(s): LIPASE, AMYLASE in the last 168 hours. No results for input(s): AMMONIA in the last 168 hours. CBC:  Recent Labs Lab 09/04/15 1758 09/05/15 0451 09/09/15 0414  WBC 10.5 8.5 6.9  NEUTROABS 8.7*  --   --   HGB 11.8* 10.7* 9.2*  HCT 36.7 32.8* 28.9*  MCV 90.2 91.4 92.9  PLT 484* 369 321   Cardiac Enzymes: No results for input(s): CKTOTAL, CKMB, CKMBINDEX, TROPONINI in the last 168 hours. BNP (last 3 results) No results for input(s): BNP in the last 8760 hours.  ProBNP (last 3 results) No results for input(s): PROBNP in the last 8760 hours.  CBG:  Recent Labs Lab 09/05/15 0738 09/06/15 0802 09/07/15 0729 09/08/15 0803 09/09/15 0748  GLUCAP 133* 93 94 83 88    Micro Recent Results (from the past 240 hour(s))  Urine culture     Status: None   Collection Time: 09/04/15  5:54 PM  Result Value Ref Range Status   Specimen Description URINE, CATHETERIZED  Final   Special Requests NONE  Final   Culture   Final    MULTIPLE SPECIES PRESENT, SUGGEST RECOLLECTION Performed at [facility]    Report Status 09/06/2015 FINAL  Final  Blood Culture (routine x 2)     Status: None (Preliminary result)   Collection Time: 09/04/15 10:00 PM  Result Value Ref Range Status   Specimen Description BLOOD BLOOD LEFT HAND  Final   Special Requests IN PEDIATRIC BOTTLE  Final   Culture   Final    NO GROWTH 4 DAYS Performed at [facility]    Report Status PENDING  Incomplete  Blood Culture (routine x 2)     Status: None (Preliminary result)   Collection Time: 09/04/15 10:00 PM  Result Value Ref Range Status   Specimen Description BLOOD BLOOD LEFT FOREARM  Final    Special Requests IN PEDIATRIC BOTTLE  Final   Culture   Final    NO GROWTH 4 DAYS Performed at [facility]    Report Status PENDING  Incomplete  MRSA PCR Screening     Status: None   Collection Time: 09/05/15 12:38 AM  Result Value Ref Range Status   MRSA by PCR NEGATIVE NEGATIVE Final    Comment:        The GeneXpert MRSA Assay (FDA approved for NASAL specimens only), is one component of a comprehensive MRSA colonization surveillance program. It is not  intended to diagnose MRSA infection nor to guide or monitor treatment for MRSA infections.      Studies: No results found.  Scheduled Meds: . atorvastatin  20 mg Oral Daily  . calcium-vitamin D  1 tablet Oral BID  . ceFEPime (MAXIPIME) IV  250 mg Intravenous Q24H  . clotrimazole   Topical BID  . famotidine  10 mg Oral Daily  . feeding supplement (ENSURE ENLIVE)  237 mL Oral BID BM  . heparin  5,000 Units Subcutaneous 3 times per day  . multivitamin with minerals  1 tablet Oral Daily  . PARoxetine  20 mg Oral Daily  . sodium chloride flush  3 mL Intravenous Q12H   Continuous Infusions: . sodium chloride 100 mL/hr at 09/09/15 0827       Time spent: 25 minutes    [facility]  [facility] Pager [phone] If 7PM-7AM, please contact night-coverage at [email or web address], password [facility] 09/09/2015, 10:06 AM  LOS: 5 days

## 2015-09-09 NOTE — Progress Notes (Signed)
Nutrition Follow-up  DOCUMENTATION CODES:   Not applicable  INTERVENTION:  - Continue Ensure Enlive BID - RD will continue to monitor for needs  NUTRITION DIAGNOSIS:   Inadequate oral intake related to lethargy/confusion as evidenced by meal completion < 25%. -improved/improving  GOAL:   Patient will meet greater than or equal to 90% of their needs -met on average  MONITOR:   PO intake, Supplement acceptance, Weight trends, Labs, Skin, I & O's  ASSESSMENT:   82 y.o. female with PMH of hypertension, hyperlipidemia, GERD, CVA, brain aneurysm (s/p of coiling per her son), right sided paralysis, who presents with altered mental status, generalized weakness, nausea and dysuria.  2/28 Per chart review, pt ate 100% of breakfast and 50% of lunch 2/26; 75% of breakfast and 50% of lunch yesterday (2/27); 75% of breakfast this AM. Per chart, pt is pleasantly confused and unable to provide information. This was the same as when RD assessment was done 2/24. Pt has Ensure Enlive ordered BID.   Pt meeting needs on average. Medications reviewed. IVF: NS @ 100 mL/hr. Labs reviewed; Cl: 120 mmol/L, BUN/creatinine elevated but trending down, Ca: 8.4 mg/dL, GFR: 16.    2/24 - Pt was NPO until diet advancement to Heart Healthy diet at 0842 today.  - Per flowsheet, pt consumed 25% of breakfast this AM.  - Pt with AMS; she is unable to provide any information and no family/visitors are present at this time.  - Lunch tray had arrived prior to RD visit and tech alerted. - Physical assessment indicates mild to moderate muscle wasting to upper body, no fat wasting, no edema.  - No recent weight hx available in the chart for comparison to current weight.    Diet Order:  Diet Heart Room service appropriate?: Yes with Assist; Fluid consistency:: Thin  Skin:  Wound (see comment) (Stage 2 bilateral buttocks and Stage 1 bilateral heels pressure ulcers)  Last BM:  2/27  Height:   Ht Readings from  Last 1 Encounters:  09/04/15 '5\' 2"'  (1.575 m)    Weight:   Wt Readings from Last 1 Encounters:  09/04/15 111 lb 15.9 oz (50.8 kg)    Ideal Body Weight:  50 kg (kg)  BMI:  Body mass index is 20.48 kg/(m^2).  Estimated Nutritional Needs:   Kcal:  1350-1550  Protein:  55-65 grams  Fluid:  1.8-2 L/day  EDUCATION NEEDS:   No education needs identified at this time     Jarome Matin, RD, LDN Inpatient Clinical Dietitian Pager # [phone] After hours/weekend pager # [phone]

## 2015-09-10 LAB — COMPREHENSIVE METABOLIC PANEL
ALBUMIN: 2.1 g/dL — AB (ref 3.5–5.0)
ALK PHOS: 56 U/L (ref 38–126)
ALT: 27 U/L (ref 14–54)
AST: 31 U/L (ref 15–41)
Anion gap: 6 (ref 5–15)
BILIRUBIN TOTAL: 0.7 mg/dL (ref 0.3–1.2)
BUN: 47 mg/dL — AB (ref 6–20)
CALCIUM: 8 mg/dL — AB (ref 8.9–10.3)
CO2: 19 mmol/L — AB (ref 22–32)
Chloride: 117 mmol/L — ABNORMAL HIGH (ref 101–111)
Creatinine, Ser: 2.47 mg/dL — ABNORMAL HIGH (ref 0.44–1.00)
GFR calc Af Amer: 20 mL/min — ABNORMAL LOW (ref >60)
GFR calc non Af Amer: 17 mL/min — ABNORMAL LOW (ref >60)
GLUCOSE: 97 mg/dL (ref 65–99)
POTASSIUM: 3.8 mmol/L (ref 3.5–5.1)
Sodium: 142 mmol/L (ref 135–145)
TOTAL PROTEIN: 4.3 g/dL — AB (ref 6.5–8.1)

## 2015-09-10 LAB — HEMOGLOBIN A1C
HEMOGLOBIN A1C: 5.8 % — AB (ref 4.8–5.6)
Mean Plasma Glucose: 120 mg/dL

## 2015-09-10 LAB — CBC
HEMATOCRIT: 26.7 % — AB (ref 36.0–46.0)
HEMOGLOBIN: 8.7 g/dL — AB (ref 12.0–15.0)
MCH: 30.3 pg (ref 26.0–34.0)
MCHC: 32.6 g/dL (ref 30.0–36.0)
MCV: 93 fL (ref 78.0–100.0)
Platelets: 298 10*3/uL (ref 150–400)
RBC: 2.87 MIL/uL — ABNORMAL LOW (ref 3.87–5.11)
RDW: 17.8 % — ABNORMAL HIGH (ref 11.5–15.5)
WBC: 6.1 10*3/uL (ref 4.0–10.5)

## 2015-09-10 LAB — GLUCOSE, CAPILLARY: Glucose-Capillary: 92 mg/dL (ref 65–99)

## 2015-09-10 MED ORDER — AMOXICILLIN-POT CLAVULANATE 500-125 MG PO TABS: 500.0000 mg | ORAL_TABLET | Freq: Two times a day (BID) | ORAL | Status: AC

## 2015-09-10 MED ORDER — ENSURE ENLIVE PO LIQD: 237.0000 mL | Freq: Two times a day (BID) | ORAL | Status: AC

## 2015-09-10 NOTE — Progress Notes (Signed)
Occupational Therapy Treatment Patient Details Name: Caitlin Wilkins MRN: [id number] DOB: [birth year] Today's Date: 09/10/2015    History of present illness 82 yo female admitted 09/04/15 from [facility] independent living per noes with Altered mental status, generalized weakness, nausea, dysuria, UTI.   OT comments  Patient making slow progress towards OT goals. OT will continue to follow.  Follow Up Recommendations  SNF;Home health OT;Supervision/Assistance - 24 hour    Equipment Recommendations  None recommended by OT    Recommendations for Other Services      Precautions / Restrictions Precautions Precautions: Fall Precaution Comments: R hemiplegia, incontinence Restrictions Weight Bearing Restrictions: No       Mobility Bed Mobility Overal bed mobility: Needs Assistance;+2 for physical assistance;+ 2 for safety/equipment Bed Mobility: Rolling;Sidelying to Sit Rolling: +2 for physical assistance;Max assist Sidelying to sit: +2 for physical assistance;Max assist   Sit to supine: +2 for physical assistance;Max assist   General bed mobility comments: assist with trunk and LEs, cues for technique and self assist  Transfers Overall transfer level: Needs assistance Equipment used: None Transfers: Sit to/from Stand;Lateral/Scoot Transfers Sit to Stand: +2 physical assistance;Max assist        Lateral/Scoot Transfers: +2 physical assistance;Max assist General transfer comment: pt assisted to chair towards her R (hemiplegic) side with, requires assist for balance, scooting/wt shift    Balance                               ADL   Eating/Feeding: Minimal assistance;Sitting   Grooming: Wash/dry hands;Wash/dry face;Set up;Sitting           Upper Body Dressing : Maximal assistance;Sitting                   Functional mobility during ADLs: Maximal assistance;+2 for physical assistance;+2 for safety/equipment General ADL Comments: Patient  willing to engage more with OT today. Co-tx with PT. Patient sat EOB and performed simple ADLs. Then was assisted to recliner, max A +2. Chair alarm in place and nursing notified of +2 A vs. lift needed to transfer her back to bed.      Vision                     Perception     Praxis      Cognition   Behavior During Therapy: WFL for tasks assessed/performed Overall Cognitive Status: No family/caregiver present to determine baseline cognitive functioning                       Extremity/Trunk Assessment               Exercises     Shoulder Instructions       General Comments      Pertinent Vitals/ Pain       Pain Assessment: No/denies pain  Home Living                                          Prior Functioning/Environment              Frequency Min 2X/week     Progress Toward Goals  OT Goals(current goals can now be found in the care plan section)  Progress towards OT goals: Progressing toward goals  Acute Rehab OT Goals Patient Stated Goal: none stated  Plan Discharge plan remains appropriate    Co-evaluation    PT/OT/SLP Co-Evaluation/Treatment: Yes Reason for Co-Treatment: For patient/therapist safety PT goals addressed during session: Mobility/safety with mobility OT goals addressed during session: ADL's and self-care      End of Session Equipment Utilized During Treatment: Gait belt   Activity Tolerance Patient tolerated treatment well   Patient Left in chair;with call bell/phone within reach;with chair alarm set   Nurse Communication Mobility status        Time: 8119-1478 OT Time Calculation (min): 24 min  Charges: OT General Charges $OT Visit: 1 Procedure OT Treatments $Self Care/Home Management : 8-22 mins  Slate Debroux A 09/10/2015, 12:29 PM

## 2015-09-10 NOTE — Progress Notes (Signed)
Reviewed discharge information with patient and caregiver. Answered all questions. Patient/caregiver able to teach back medications and reasons to contact MD/911. Patient verbalizes importance of PCP follow up appointment.  Kayceon Oki M. Majid Mccravy, RN  

## 2015-09-10 NOTE — Progress Notes (Signed)
Physical Therapy Treatment Patient Details Name: Caitlin Wilkins MRN: [id number] DOB: [birth year] Today's Date: 09/10/2015    History of Present Illness 82 yo female admitted 09/04/15 from [facility] independent living per noes with Altered mental status, generalized weakness, nausea, dysuria, UTI.    PT Comments    Pt very pleasant and cooperative, able to transfer to chair today with +2 assist   Follow Up Recommendations  SNF;Supervision/Assistance - 24 hour     Equipment Recommendations  None recommended by PT    Recommendations for Other Services       Precautions / Restrictions Precautions Precautions: Fall Precaution Comments: R hemiplegia, incontinence Restrictions Weight Bearing Restrictions: No    Mobility  Bed Mobility Overal bed mobility: Needs Assistance;+2 for physical assistance;+ 2 for safety/equipment Bed Mobility: Rolling;Sidelying to Sit Rolling: +2 for physical assistance;Max assist Sidelying to sit: +2 for physical assistance;Max assist   Sit to supine: +2 for physical assistance;Max assist   General bed mobility comments: assist with trunk and LEs, cues for technique and self assist  Transfers Overall transfer level: Needs assistance Equipment used: None Transfers: Sit to/from Stand;Lateral/Scoot Transfers Sit to Stand: +2 physical assistance;Max assist        Lateral/Scoot Transfers: +2 physical assistance;Max assist General transfer comment: pt assisted to chair towards her R (hemiplegic) side with, requires assist for balance, scooting/wt shift  Ambulation/Gait                 Stairs            Wheelchair Mobility    Modified Rankin (Stroke Patients Only)       Balance Overall balance assessment: Needs assistance Sitting-balance support: Single extremity supported;No upper extremity supported;Feet supported Sitting balance-Leahy Scale: Fair Sitting balance - Comments: EOB for grooming tasks                             Cognition Arousal/Alertness: Awake/alert Behavior During Therapy: WFL for tasks assessed/performed Overall Cognitive Status: No family/caregiver present to determine baseline cognitive functioning                      Exercises      General Comments        Pertinent Vitals/Pain Pain Assessment: No/denies pain    Home Living                      Prior Function            PT Goals (current goals can now be found in the care plan section) Acute Rehab PT Goals Patient Stated Goal: non stated PT Goal Formulation: Patient unable to participate in goal setting Time For Goal Achievement: 09/19/15 Potential to Achieve Goals: Fair Progress towards PT goals: Progressing toward goals    Frequency  Min 2X/week    PT Plan Current plan remains appropriate    Co-evaluation PT/OT/SLP Co-Evaluation/Treatment: Yes Reason for Co-Treatment: For patient/therapist safety PT goals addressed during session: Mobility/safety with mobility;Balance       End of Session Equipment Utilized During Treatment: Gait belt Activity Tolerance: Patient tolerated treatment well Patient left: in chair;with call bell/phone within reach;with chair alarm set     Time: 0865-7846 PT Time Calculation (min) (ACUTE ONLY): 23 min  Charges:  $Therapeutic Activity: 8-22 mins                    G Codes:  [facility] 09/10/2015, 10:25 AM

## 2015-09-10 NOTE — Discharge Summary (Signed)
Caitlin Wilkins, is a 82 y.o. female  DOB 07-24-1928  MRN [id number].  Admission date:  09/04/2015  Admitting Physician  Lorretta Harp, MD  Discharge Date:  09/10/2015   Primary MD  No primary care provider on file.  Recommendations for primary care physician for things to follow:  - Please check CBC, BMP during next visit, ensure creatinine is continuing to improve.   Admission Diagnosis  Hematuria [R31.9]   Discharge Diagnosis  Hematuria [R31.9]    Principal Problem:   UTI (lower urinary tract infection) Active Problems:   HLD (hyperlipidemia)   Essential hypertension   Acute encephalopathy   Decubitus ulcer   Hyperkalemia   AKI (acute kidney injury) (HCC)   Depression   GERD (gastroesophageal reflux disease)      Past Medical History  Diagnosis Date  . CVA (cerebral infarction)   . Hypercholesteremia   . Hypertension   . UTI (urinary tract infection)   . Depression   . GERD (gastroesophageal reflux disease)     Past Surgical History  Procedure Laterality Date  . Cerebral aneurysm repair      s/p of coling per her son with right-sided paralysis       History of present illness and  Hospital Course:     Kindly see H&P for history of present illness and admission details, please review complete Labs, Consult reports and Test reports for all details in brief  HPI  from the history and physical done on the day of admission 09/04/2015 HPI: Caitlin Wilkins is a 82 y.o. female with PMH of hypertension, hyperlipidemia, GERD, CVA, brain aneurysm (s/p of coiling per her son), right sided paralysis, who presents with altered mental status, generalized weakness, nausea and dysuria.  Per her son, patient has been confused in the past 2 or 3 days. She also has generalized weakness, nausea, but no vomiting, diarrhea or abdominal pain. Patient has dysuria, burning on urination. She denies chest pain,  shortness of breath, cough, rashes. Patient has right-sided paralysis due to previous CVA and brain aneurysm coiling, which is her baseline.  In ED, patient was found to have positive urinalysis with moderate amount of leukocytes, lactate 1.90, WBC 10.5, temperature normal, no tachycardia, no tachypnea, potassium 5.9 with mild T-wave peaking in V4-V5, sodium 132, creatinine 4.90, bicarbonate 15. CT-renal stone study showed asymmetric right and posterior bladder wall thickening. No nephrolithiasis or hydroureteronephrosis bilaterally. Patient is admitted to inpatient for further evaluation and treatment.   Hospital Course   UTI (lower urinary tract infection):  - Presented with somnolence and more confusion with baselineoff chronic dementia, dysuria and urinalysis consistent with UTI. - Initially treated with IVcefepime, transitioned to by mouth Augmentin, to finish another 4 days after discharge - Urine culture showed multiple morphotypes, urine was strongly positive on admission  Hyperglycemia  - Blood sugar 288 on 2/24,  hemoglobin A1c is 5.8  Acute renal failure - Likely prerenal secondary to volume depletion and dehydration, as well using diuretics and NSAIDs - mCreatinine 4.90, BUN  112. , Continues to improve on IV hydration, attends 2.4, BUN is 47 at time of discharge, a shunt with good oral intake, good fluid intake, we'll hold her diuresis and NSAIDs, encouraged to increase her fluid intake. -  No hydronephrosis by CT scan. - Continue to Hold HCTZ and naproxen on discharge.  Acute encephalopathy:  - Is likely multifactorial including UTI, dehydration and acute renal failure. - Somnolence and sleepiness resolved, back to baseline, which is a pleasantly demented  Hyperkalemia:  Initially presented with hyperkalemia with Potassium 5.9 with mild T-wave peaking.  potassium 8.8 on discharge, resolved  Hypokalemia Resolved.   HLD:  -Continue home medications: Lipitor - FLP,  LDL 53  Essential hypertension: -Hold HCTZ due to worsening renal function - Low pressure is acceptable during hospital stay, will not start any new medication on discharge   Depression: stable -continue Paxil  GERD: -Pepcid  Decubitus ulcer: -Wound care consulted  Hypophosphatemia Repleted       Discharge Condition: Stable  Follow UP  Follow-up Information    Follow up with Caitlin Wilkins,Caitlin Wilkins. Schedule an appointment as soon as possible for a visit in 1 week.   Specialty:  Family Medicine   Contact information:   [address] [place] [address] [phone]         Discharge Instructions  and  Discharge Medications     Discharge Instructions    Diet - low sodium heart healthy    Complete by:  As directed      Discharge instructions    Complete by:  As directed   Follow with Primary MD in 7 days   Get CBC, CMP, 2 view Chest X ray checked  by Primary MD next visit.    Activity: As tolerated with Full fall precautions use walker/cane & assistance as needed   Disposition Home    Diet: Heart Healthy  , with feeding assistance and aspiration precautions.  For Heart failure patients - Check your Weight same time everyday, if you gain over 2 pounds, or you develop in leg swelling, experience more shortness of breath or chest pain, call your Primary MD immediately. Follow Cardiac Low Salt Diet and 1.5 lit/day fluid restriction.   On your next visit with your primary care physician please Get Medicines reviewed and adjusted.   Please request your Prim.MD to go over all Hospital Tests and Procedure/Radiological results at the follow up, please get all Hospital records sent to your Prim MD by signing hospital release before you go home.   If you experience worsening of your admission symptoms, develop shortness of breath, life threatening emergency, suicidal or homicidal thoughts you must seek medical attention immediately by calling 911 or calling  your MD immediately  if symptoms less severe.  You Must read complete instructions/literature along with all the possible adverse reactions/side effects for all the Medicines you take and that have been prescribed to you. Take any new Medicines after you have completely understood and accpet all the possible adverse reactions/side effects.   Do not drive, operating heavy machinery, perform activities at heights, swimming or participation in water activities or provide baby sitting services if your were admitted for syncope or siezures until you have seen by Primary MD or a Neurologist and advised to do so again.  Do not drive when taking Pain medications.    Do not take more than prescribed Pain, Sleep and Anxiety Medications  Special Instructions: If you have smoked or chewed Tobacco  in the last 2 yrs  please stop smoking, stop any regular Alcohol  and or any Recreational drug use.  Wear Seat belts while driving.   Please note  You were cared for by a hospitalist during your hospital stay. If you have any questions about your discharge medications or the care you received while you were in the hospital after you are discharged, you can call the unit and asked to speak with the hospitalist on call if the hospitalist that took care of you is not available. Once you are discharged, your primary care physician will handle any further medical issues. Please note that NO REFILLS for any discharge medications will be authorized once you are discharged, as it is imperative that you return to your primary care physician (or establish a relationship with a primary care physician if you do not have one) for your aftercare needs so that they can reassess your need for medications and monitor your lab values.     Increase activity slowly    Complete by:  As directed             Medication List    STOP taking these medications        hydrochlorothiazide 25 MG tablet  Commonly known as:  HYDRODIURIL      naproxen sodium 220 MG tablet  Commonly known as:  ANAPROX      TAKE these medications        amoxicillin-clavulanate 500-125 MG tablet  Commonly known as:  AUGMENTIN  Take 1 tablet (500 mg total) by mouth every 12 (twelve) hours. Stick for total of 4 days then stop     atorvastatin 20 MG tablet  Commonly known as:  LIPITOR  Take 20 mg by mouth daily.     CALCIUM 600+D 600-200 MG-UNIT Tabs  Generic drug:  Calcium Carbonate-Vitamin D  Take 1 tablet by mouth 2 (two) times daily.     famotidine 10 MG tablet  Commonly known as:  PEPCID  Take 10 mg by mouth daily.     feeding supplement (ENSURE ENLIVE) Liqd  Take 237 mLs by mouth 2 (two) times daily between meals.     Glucosamine Sulfate 1000 MG Tabs  Take 1 tablet by mouth daily.     KLOR-CON M20 20 MEQ tablet  Generic drug:  potassium chloride SA  Take 20 mEq by mouth daily.     LOTRISONE cream  Generic drug:  clotrimazole-betamethasone  Apply 1 application topically 2 (two) times daily as needed. irritations     MULTIVITAMIN & MINERAL PO  Take 1 tablet by mouth daily.     PARoxetine 20 MG tablet  Commonly known as:  PAXIL  Take 20 mg by mouth daily.          Diet and Activity recommendation: See Discharge Instructions above   Consults obtained -  None Major procedures and Radiology Reports - PLEASE review detailed and final reports for all details, in brief -      Ct Renal Stone Study  09/04/2015  CLINICAL DATA:  Hematuria, multiple urinary tract infection recently. EXAM: CT ABDOMEN AND PELVIS WITHOUT CONTRAST TECHNIQUE: Multidetector CT imaging of the abdomen and pelvis was performed following the standard protocol without IV contrast. COMPARISON:  None. FINDINGS: Calcified granulomas are identified within the liver. No focal liver lesion is identified. There is question sludge in the gallbladder. There is no inflammation surrounding the gallbladder. Calcified granuloma is identified in the spleen. The  spleen is otherwise normal. The pancreas is normal. The adrenal  glands are normal. There is no nephrolithiasis or hydroureteronephrosis bilaterally. Atherosclerosis of the aorta is identified without aneurysmal dilatation. There is no abdominal lymphadenopathy. There is no small bowel obstruction or diverticulitis. The appendix is not seen but no inflammation is noted around the cecum. The bladder is partially decompressed. There is asymmetric right and posterior bladder wall thickening. The uterus is normal. The lung bases are clear. Chronic compression deformity of L4 is identified. IMPRESSION: Asymmetric right and posterior bladder wall thickening. Further evaluation with direct with visualization on outpatient basis is recommended to exclude underlying neoplasm. No nephrolithiasis or hydroureteronephrosis bilaterally. Electronically Signed   By: Sherian Rein M.D.   On: 09/04/2015 20:53    Micro Results     Recent Results (from the past 240 hour(s))  Urine culture     Status: None   Collection Time: 09/04/15  5:54 PM  Result Value Ref Range Status   Specimen Description URINE, CATHETERIZED  Final   Special Requests NONE  Final   Culture   Final    MULTIPLE SPECIES PRESENT, SUGGEST RECOLLECTION Performed at [facility]    Report Status 09/06/2015 FINAL  Final  Blood Culture (routine x 2)     Status: None   Collection Time: 09/04/15 10:00 PM  Result Value Ref Range Status   Specimen Description BLOOD BLOOD LEFT HAND  Final   Special Requests IN PEDIATRIC BOTTLE  Final   Culture   Final    NO GROWTH 5 DAYS Performed at [facility]    Report Status 09/09/2015 FINAL  Final  Blood Culture (routine x 2)     Status: None   Collection Time: 09/04/15 10:00 PM  Result Value Ref Range Status   Specimen Description BLOOD BLOOD LEFT FOREARM  Final   Special Requests IN PEDIATRIC BOTTLE  Final   Culture   Final    NO GROWTH 5 DAYS Performed at [facility]     Report Status 09/09/2015 FINAL  Final  MRSA PCR Screening     Status: None   Collection Time: 09/05/15 12:38 AM  Result Value Ref Range Status   MRSA by PCR NEGATIVE NEGATIVE Final    Comment:        The GeneXpert MRSA Assay (FDA approved for NASAL specimens only), is one component of a comprehensive MRSA colonization surveillance program. It is not intended to diagnose MRSA infection nor to guide or monitor treatment for MRSA infections.        Today   Subjective:   Caitlin Wilkins today has no headache,no chest or abdominal pain,, feels much better  today.   Objective:   Blood pressure 117/52, pulse 91, temperature 98.2 F (36.8 C), temperature source Oral, resp. rate 18, height  (1.575 m), weight 50.8 kg (111 lb 15.9 oz), SpO2 100 %.   Intake/Output Summary (Last 24 hours) at 09/10/15 1211 Last data filed at 09/10/15 0815  Gross per 24 hour  Intake 2762.08 ml  Output      1 ml  Net 2761.08 ml    Exam General:not in any acute distress. HEENT: anicteric sclera, pupils reactive to light and accommodation, EOMI CVS: S1-S2 clear, no murmur rubs or gallops Chest: clear to auscultation bilaterally, no wheezing, rales or rhonchi Abdomen: soft nontender, nondistended, normal bowel sounds, no organomegaly Extremities: no cyanosis, clubbing or edema noted bilaterally Neuro: alert, pleasantly confuse.   Data Review   CBC w Diff: Lab Results  Component Value Date  WBC 6.1 09/10/2015   HGB 8.7* 09/10/2015   HCT 26.7* 09/10/2015   PLT 298 09/10/2015   LYMPHOPCT 10 09/04/2015   MONOPCT 5 09/04/2015   EOSPCT 2 09/04/2015   BASOPCT 0 09/04/2015    CMP: Lab Results  Component Value Date   NA 142 09/10/2015   K 3.8 09/10/2015   CL 117* 09/10/2015   CO2 19* 09/10/2015   BUN 47* 09/10/2015   CREATININE 2.47* 09/10/2015   PROT 4.3* 09/10/2015   ALBUMIN 2.1* 09/10/2015   BILITOT 0.7 09/10/2015   ALKPHOS 56 09/10/2015   AST 31 09/10/2015   ALT 27 09/10/2015    .   Total Time in preparing paper work, data evaluation and todays exam - 35 minutes  Caitlin Wilkins M.D on 09/10/2015 at 12:11 PM  [facility]   Office  [phone]

## 2015-09-10 NOTE — Discharge Instructions (Signed)
Follow with Primary MD  in 7 days  ° °Get CBC, CMP, 2 view Chest X ray checked  by Primary MD next visit.  ° ° °Activity: As tolerated with Full fall precautions use walker/cane & assistance as needed ° ° °Disposition Home  ° ° °Diet: Heart Healthy  , with feeding assistance and aspiration precautions. ° °For Heart failure patients - Check your Weight same time everyday, if you gain over 2 pounds, or you develop in leg swelling, experience more shortness of breath or chest pain, call your Primary MD immediately. Follow Cardiac Low Salt Diet and 1.5 lit/day fluid restriction. ° ° °On your next visit with your primary care physician please Get Medicines reviewed and adjusted. ° ° °Please request your Prim.MD to go over all Hospital Tests and Procedure/Radiological results at the follow up, please get all Hospital records sent to your Prim MD by signing hospital release before you go home. ° ° °If you experience worsening of your admission symptoms, develop shortness of breath, life threatening emergency, suicidal or homicidal thoughts you must seek medical attention immediately by calling 911 or calling your MD immediately  if symptoms less severe. ° °You Must read complete instructions/literature along with all the possible adverse reactions/side effects for all the Medicines you take and that have been prescribed to you. Take any new Medicines after you have completely understood and accpet all the possible adverse reactions/side effects.  ° °Do not drive, operating heavy machinery, perform activities at heights, swimming or participation in water activities or provide baby sitting services if your were admitted for syncope or siezures until you have seen by Primary MD or a Neurologist and advised to do so again. ° °Do not drive when taking Pain medications.  ° ° °Do not take more than prescribed Pain, Sleep and Anxiety Medications ° °Special Instructions: If you have smoked or chewed Tobacco  in the last 2 yrs  please stop smoking, stop any regular Alcohol  and or any Recreational drug use. ° °Wear Seat belts while driving. ° ° °Please note ° °You were cared for by a hospitalist during your hospital stay. If you have any questions about your discharge medications or the care you received while you were in the hospital after you are discharged, you can call the unit and asked to speak with the hospitalist on call if the hospitalist that took care of you is not available. Once you are discharged, your primary care physician will handle any further medical issues. Please note that NO REFILLS for any discharge medications will be authorized once you are discharged, as it is imperative that you return to your primary care physician (or establish a relationship with a primary care physician if you do not have one) for your aftercare needs so that they can reassess your need for medications and monitor your lab values. ° °

## 2015-09-10 NOTE — Progress Notes (Signed)
Spoke with pt's son Lorin Picket at bedisde concerning discharge plan and transportation. Lorin Picket states that he will transportation pt back to [facility].

## 2017-01-13 IMAGING — CT CT RENAL STONE PROTOCOL
2 of 4 series · 17 of 46 positions shown, 19 images · non-contrast
Comparison: None.

CLINICAL DATA: Hematuria, multiple urinary tract infection
recently.

EXAM:
CT ABDOMEN AND PELVIS WITHOUT CONTRAST
TECHNIQUE: Multidetector CT imaging of the abdomen and pelvis was performed
following the standard protocol without IV contrast.

[Series 2: abd/pel w/o · axial · non-contrast · 0.77mm/px · z∈[-222,+148]mm · 14 of 82 slices shown, 16 images]
[im 4/82  soft-tissue]
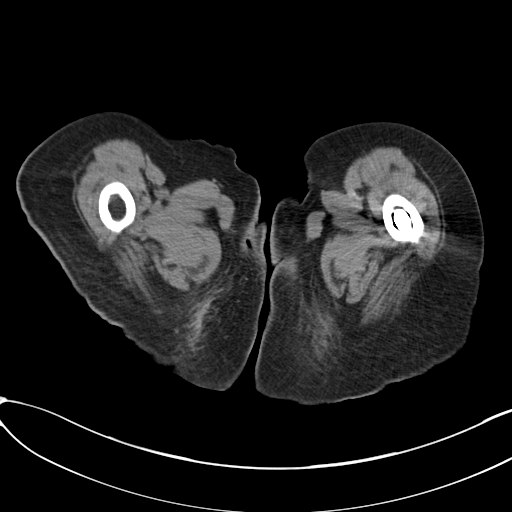
[im 4/82  bone]
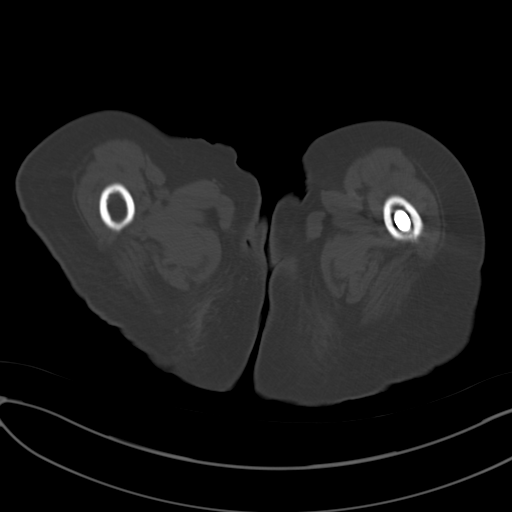
[im 11/82  soft-tissue]
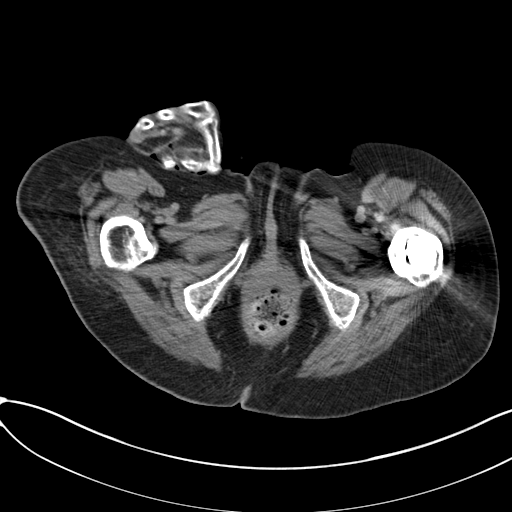
[im 15/82  soft-tissue]
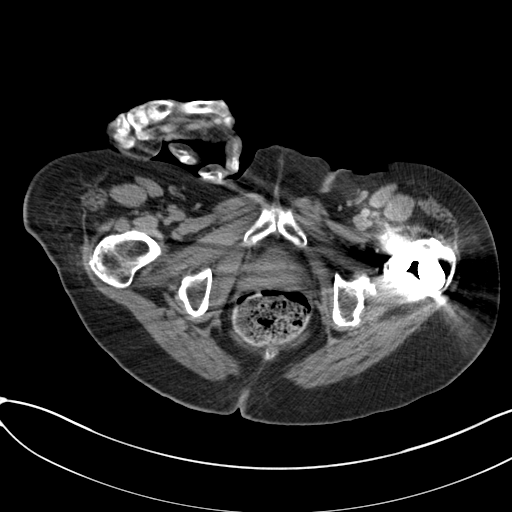
[im 22/82  soft-tissue]
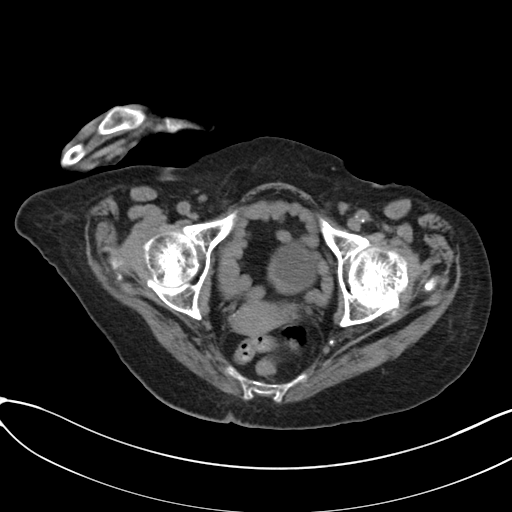
[im 29/82  soft-tissue]
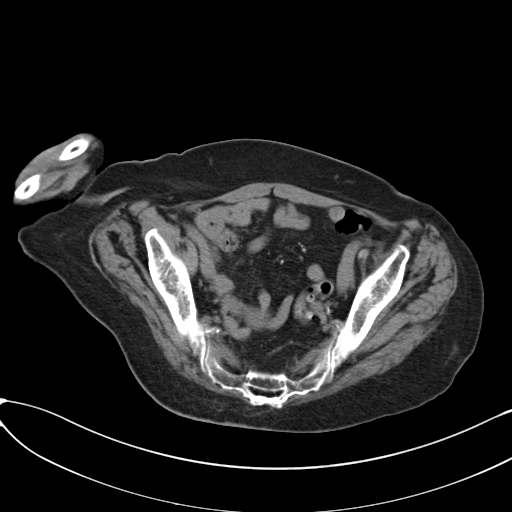
[im 32/82  soft-tissue]
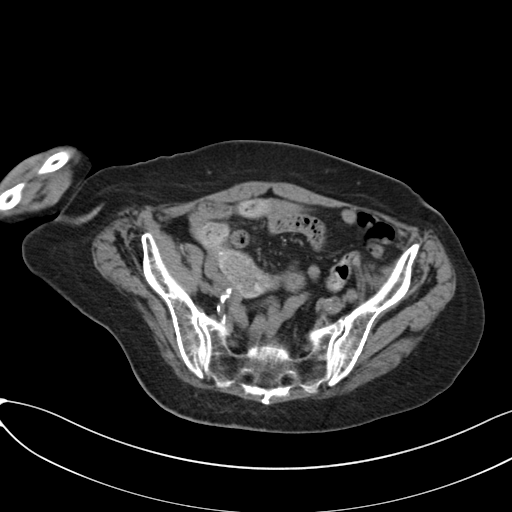
[im 39/82  soft-tissue]
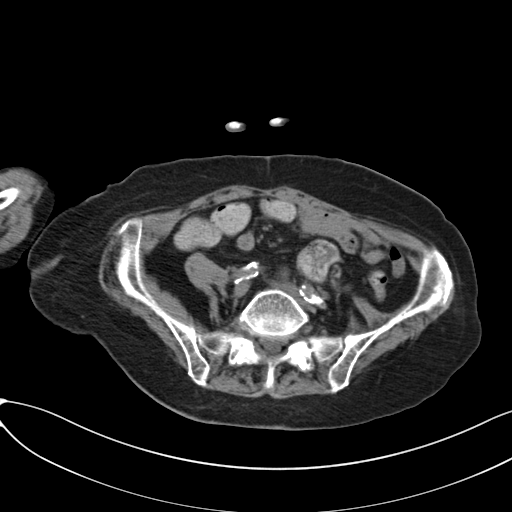
[im 43/82  soft-tissue]
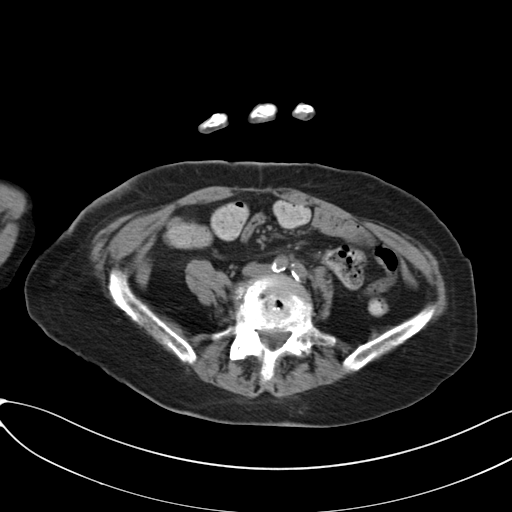
[im 50/82  soft-tissue]
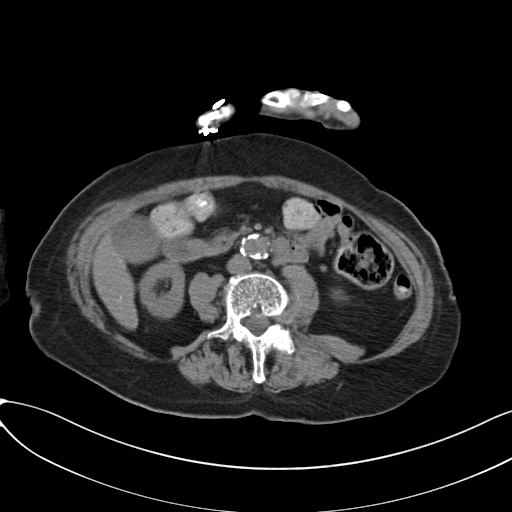
[im 50/82  bone]
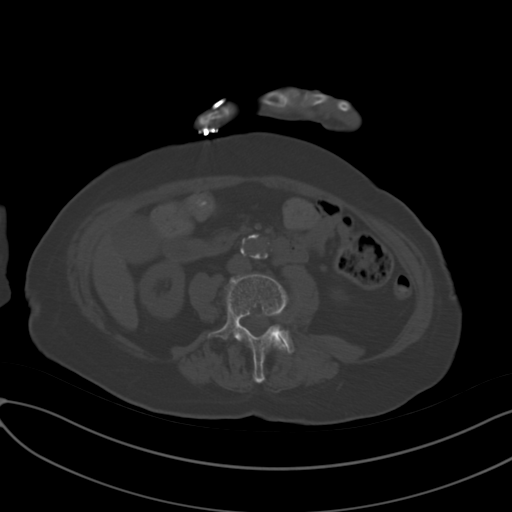
[im 53/82  soft-tissue]
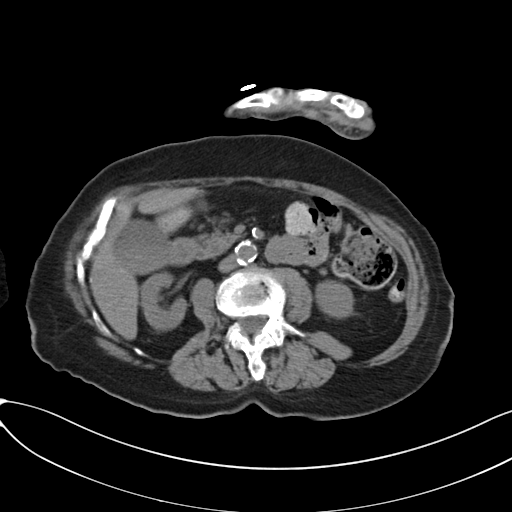
[im 60/82  soft-tissue]
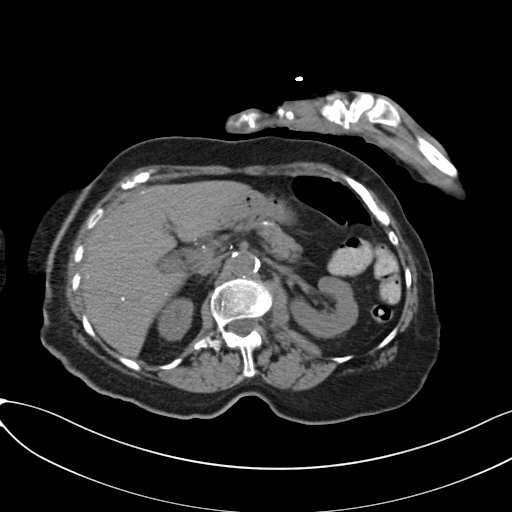
[im 67/82  soft-tissue]
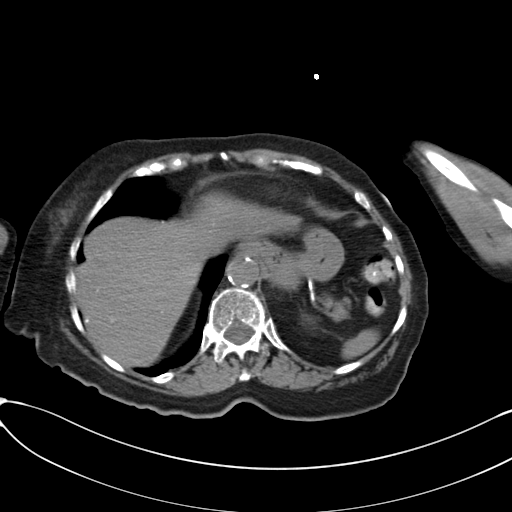
[im 71/82  soft-tissue]
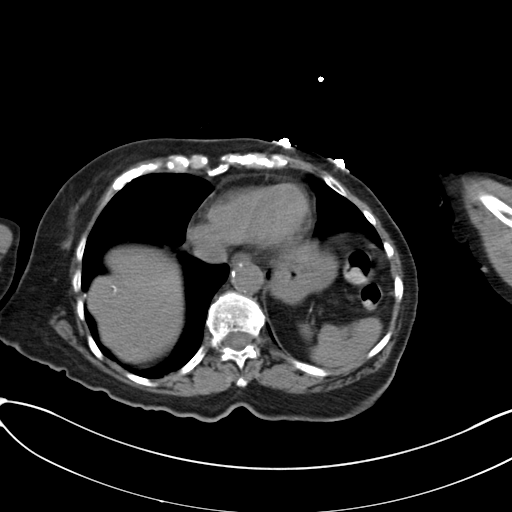
[im 78/82  soft-tissue]
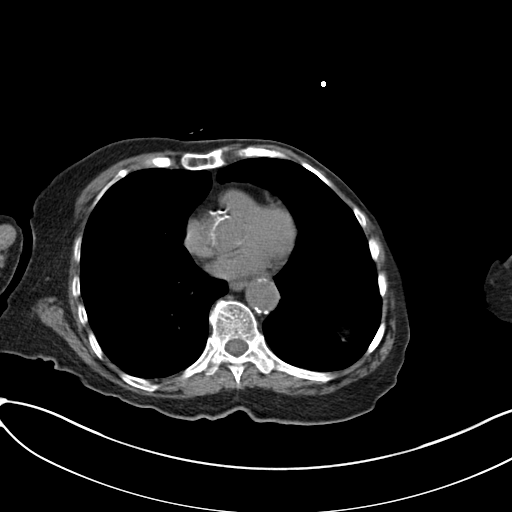

[Series 3: coronal · coronal · 0.75mm/px · 3 of 80 slices shown]
[im 27/80  soft-tissue]
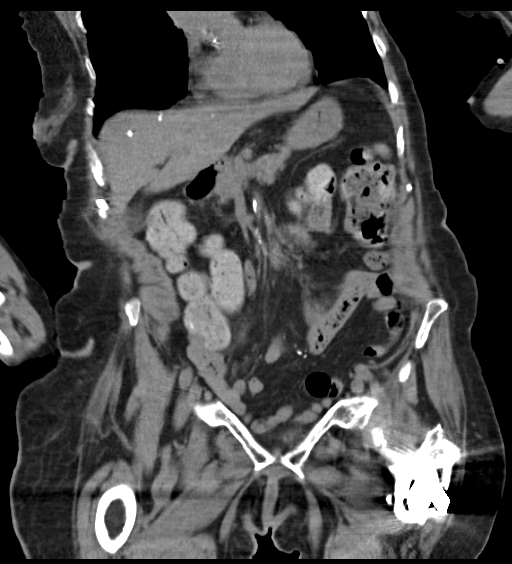
[im 36/80  soft-tissue]
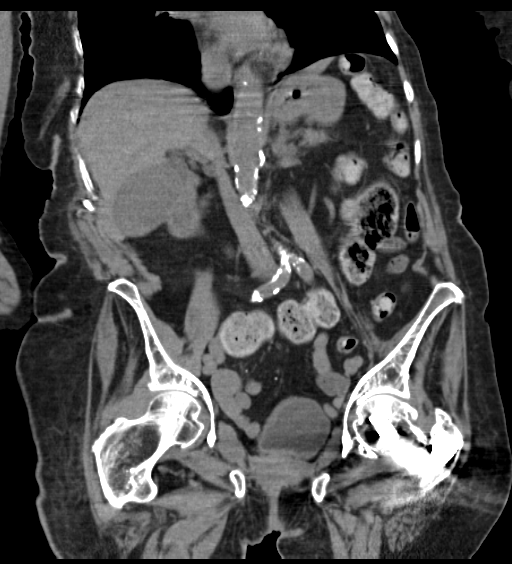
[im 44/80  soft-tissue]
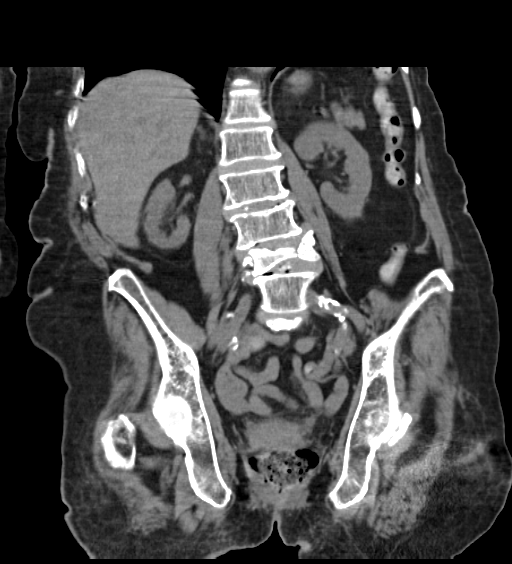

[17 of 46 positions shown; findings below may reference images not displayed]

FINDINGS: Calcified granulomas are identified within the liver. No focal liver
lesion is identified. There is question sludge in the gallbladder.
There is no inflammation surrounding the gallbladder.

Calcified granuloma is identified in the spleen. The spleen is
otherwise normal.

The pancreas is normal.

The adrenal glands are normal. There is no nephrolithiasis or
hydroureteronephrosis bilaterally.

Atherosclerosis of the aorta is identified without aneurysmal
dilatation. There is no abdominal lymphadenopathy.

There is no small bowel obstruction or diverticulitis. The appendix
is not seen but no inflammation is noted around the cecum.

The bladder is partially decompressed. There is asymmetric right and
posterior bladder wall thickening.

The uterus is normal.

The lung bases are clear. Chronic compression deformity of L4 is
identified.
IMPRESSION: Asymmetric right and posterior bladder wall thickening. Further
evaluation with direct with visualization on outpatient basis is
recommended to exclude underlying neoplasm.

No nephrolithiasis or hydroureteronephrosis bilaterally.

## 2020-07-12 DEATH — deceased
